# Patient Record
Sex: Female | Born: 1980 | Race: Black or African American | Hispanic: No | Marital: Single | Smoking: Former smoker
Health system: Southern US, Community
[De-identification: ages and names within clinical notes are randomized; demographics above are authoritative.]

## PROBLEM LIST (undated history)

## (undated) DIAGNOSIS — G932 Benign intracranial hypertension: Secondary | ICD-10-CM

## (undated) DIAGNOSIS — E785 Hyperlipidemia, unspecified: Secondary | ICD-10-CM

## (undated) DIAGNOSIS — G5600 Carpal tunnel syndrome, unspecified upper limb: Secondary | ICD-10-CM

## (undated) DIAGNOSIS — G459 Transient cerebral ischemic attack, unspecified: Secondary | ICD-10-CM

## (undated) HISTORY — DX: Benign intracranial hypertension: G93.2

## (undated) HISTORY — DX: Hyperlipidemia, unspecified: E78.5

---

## 2000-01-31 ENCOUNTER — Emergency Department (HOSPITAL_COMMUNITY): Admission: EM | Admit: 2000-01-31 | Discharge: 2000-02-01 | Payer: Self-pay | Admitting: Emergency Medicine

## 2005-12-02 ENCOUNTER — Emergency Department (HOSPITAL_COMMUNITY): Admission: EM | Admit: 2005-12-02 | Discharge: 2005-12-02 | Payer: Self-pay | Admitting: Emergency Medicine

## 2005-12-03 ENCOUNTER — Ambulatory Visit: Payer: Self-pay | Admitting: Cardiology

## 2005-12-30 ENCOUNTER — Ambulatory Visit: Payer: Self-pay

## 2005-12-30 ENCOUNTER — Ambulatory Visit: Payer: Self-pay | Admitting: Cardiology

## 2005-12-30 ENCOUNTER — Encounter: Payer: Self-pay | Admitting: Cardiology

## 2009-05-12 ENCOUNTER — Emergency Department (HOSPITAL_COMMUNITY): Admission: EM | Admit: 2009-05-12 | Discharge: 2009-05-12 | Payer: Self-pay | Admitting: *Deleted

## 2010-12-03 ENCOUNTER — Emergency Department (HOSPITAL_BASED_OUTPATIENT_CLINIC_OR_DEPARTMENT_OTHER)
Admission: EM | Admit: 2010-12-03 | Discharge: 2010-12-03 | Payer: Self-pay | Source: Home / Self Care | Admitting: Emergency Medicine

## 2010-12-23 HISTORY — PX: CHOLECYSTECTOMY: SHX55

## 2011-02-06 ENCOUNTER — Ambulatory Visit (HOSPITAL_COMMUNITY): Admission: RE | Admit: 2011-02-06 | Payer: Self-pay | Source: Home / Self Care | Admitting: Surgery

## 2011-02-20 ENCOUNTER — Ambulatory Visit (HOSPITAL_COMMUNITY): Admission: RE | Admit: 2011-02-20 | Payer: Self-pay | Source: Ambulatory Visit | Admitting: Surgery

## 2011-03-05 LAB — DIFFERENTIAL
Basophils Absolute: 0 10*3/uL (ref 0.0–0.1)
Eosinophils Absolute: 0.5 10*3/uL (ref 0.0–0.7)
Eosinophils Relative: 4 % (ref 0–5)
Lymphs Abs: 2 10*3/uL (ref 0.7–4.0)
Monocytes Absolute: 0.7 10*3/uL (ref 0.1–1.0)

## 2011-03-05 LAB — LIPASE, BLOOD: Lipase: 172 U/L (ref 23–300)

## 2011-03-05 LAB — COMPREHENSIVE METABOLIC PANEL
ALT: 42 U/L — ABNORMAL HIGH (ref 0–35)
AST: 67 U/L — ABNORMAL HIGH (ref 0–37)
Albumin: 4.1 g/dL (ref 3.5–5.2)
CO2: 25 mEq/L (ref 19–32)
Chloride: 106 mEq/L (ref 96–112)
GFR calc Af Amer: >60 mL/min (ref >60)
GFR calc non Af Amer: >60 mL/min (ref >60)
Sodium: 142 mEq/L (ref 135–145)
Total Bilirubin: 0.6 mg/dL (ref 0.3–1.2)

## 2011-03-05 LAB — URINALYSIS, ROUTINE W REFLEX MICROSCOPIC
Nitrite: NEGATIVE
Specific Gravity, Urine: 1.02 (ref 1.005–1.030)
pH: 7.5 (ref 5.0–8.0)

## 2011-03-05 LAB — CBC
Hemoglobin: 12.2 g/dL (ref 12.0–15.0)
RBC: 4.69 MIL/uL (ref 3.87–5.11)
WBC: 10.8 10*3/uL — ABNORMAL HIGH (ref 4.0–10.5)

## 2011-03-05 LAB — PREGNANCY, URINE: Preg Test, Ur: NEGATIVE

## 2011-04-02 LAB — COMPREHENSIVE METABOLIC PANEL
ALT: 42 U/L — ABNORMAL HIGH (ref 0–35)
Alkaline Phosphatase: 162 U/L — ABNORMAL HIGH (ref 39–117)
BUN: 12 mg/dL (ref 6–23)
CO2: 20 mEq/L (ref 19–32)
Chloride: 109 mEq/L (ref 96–112)
Glucose, Bld: 109 mg/dL — ABNORMAL HIGH (ref 70–99)
Potassium: 3.5 mEq/L (ref 3.5–5.1)
Sodium: 141 mEq/L (ref 135–145)
Total Bilirubin: 0.3 mg/dL (ref 0.3–1.2)
Total Protein: 6.9 g/dL (ref 6.0–8.3)

## 2011-04-02 LAB — URINALYSIS, ROUTINE W REFLEX MICROSCOPIC
Bilirubin Urine: NEGATIVE
Ketones, ur: NEGATIVE mg/dL
Nitrite: NEGATIVE
pH: 5.5 (ref 5.0–8.0)

## 2011-04-02 LAB — URINE MICROSCOPIC-ADD ON

## 2011-04-02 LAB — CBC
HCT: 35.6 % — ABNORMAL LOW (ref 36.0–46.0)
Hemoglobin: 11.7 g/dL — ABNORMAL LOW (ref 12.0–15.0)
RBC: 4.38 MIL/uL (ref 3.87–5.11)
WBC: 13 10*3/uL — ABNORMAL HIGH (ref 4.0–10.5)

## 2011-04-02 LAB — PROTIME-INR
INR: 1 (ref 0.00–1.49)
Prothrombin Time: 13 seconds (ref 11.6–15.2)

## 2011-04-02 LAB — DIFFERENTIAL
Basophils Absolute: 0.1 10*3/uL (ref 0.0–0.1)
Basophils Relative: 1 % (ref 0–1)
Eosinophils Absolute: 0.5 10*3/uL (ref 0.0–0.7)
Monocytes Relative: 2 % — ABNORMAL LOW (ref 3–12)
Neutro Abs: 9.5 10*3/uL — ABNORMAL HIGH (ref 1.7–7.7)
Neutrophils Relative %: 73 % (ref 43–77)

## 2011-04-02 LAB — RAPID URINE DRUG SCREEN, HOSP PERFORMED
Barbiturates: NOT DETECTED
Benzodiazepines: NOT DETECTED

## 2011-04-02 LAB — POCT PREGNANCY, URINE: Preg Test, Ur: NEGATIVE

## 2011-04-02 LAB — GLUCOSE, CAPILLARY: Glucose-Capillary: 125 mg/dL — ABNORMAL HIGH (ref 70–99)

## 2011-04-02 LAB — ETHANOL: Alcohol, Ethyl (B): 124 mg/dL — ABNORMAL HIGH (ref 0–10)

## 2011-04-02 LAB — ACETAMINOPHEN LEVEL: Acetaminophen (Tylenol), Serum: <10 ug/mL — ABNORMAL LOW (ref 10–30)

## 2011-04-11 ENCOUNTER — Emergency Department (HOSPITAL_BASED_OUTPATIENT_CLINIC_OR_DEPARTMENT_OTHER)
Admission: EM | Admit: 2011-04-11 | Discharge: 2011-04-12 | Disposition: A | Discharge status: Home / Self Care | Payer: BC Managed Care – PPO | Attending: Emergency Medicine | Admitting: Emergency Medicine

## 2011-04-11 DIAGNOSIS — R1013 Epigastric pain: Secondary | ICD-10-CM | POA: Insufficient documentation

## 2011-04-11 DIAGNOSIS — F17200 Nicotine dependence, unspecified, uncomplicated: Secondary | ICD-10-CM | POA: Insufficient documentation

## 2011-04-11 DIAGNOSIS — J45909 Unspecified asthma, uncomplicated: Secondary | ICD-10-CM | POA: Insufficient documentation

## 2011-04-11 DIAGNOSIS — K8020 Calculus of gallbladder without cholecystitis without obstruction: Secondary | ICD-10-CM | POA: Insufficient documentation

## 2011-04-11 LAB — DIFFERENTIAL
Basophils Absolute: 0 10*3/uL (ref 0.0–0.1)
Basophils Relative: 0 % (ref 0–1)
Eosinophils Absolute: 0.5 10*3/uL (ref 0.0–0.7)
Eosinophils Relative: 4 % (ref 0–5)
Lymphocytes Relative: 28 % (ref 12–46)
Lymphs Abs: 3.4 K/uL (ref 0.7–4.0)
Monocytes Absolute: 1.2 K/uL — ABNORMAL HIGH (ref 0.1–1.0)
Monocytes Relative: 10 % (ref 3–12)
Neutro Abs: 6.8 10*3/uL (ref 1.7–7.7)
Neutrophils Relative %: 57 % (ref 43–77)

## 2011-04-11 LAB — URINALYSIS, ROUTINE W REFLEX MICROSCOPIC
Glucose, UA: NEGATIVE mg/dL
Hgb urine dipstick: NEGATIVE
Protein, ur: NEGATIVE mg/dL
Specific Gravity, Urine: 1.033 — ABNORMAL HIGH (ref 1.005–1.030)
Urobilinogen, UA: 2 mg/dL — ABNORMAL HIGH (ref 0.0–1.0)

## 2011-04-11 LAB — CBC
HCT: 34.2 % — ABNORMAL LOW (ref 36.0–46.0)
Hemoglobin: 10.9 g/dL — ABNORMAL LOW (ref 12.0–15.0)
MCH: 26.5 pg (ref 26.0–34.0)
MCHC: 31.9 g/dL (ref 30.0–36.0)
MCV: 83 fL (ref 78.0–100.0)
Platelets: 291 10*3/uL (ref 150–400)
RBC: 4.12 MIL/uL (ref 3.87–5.11)
RDW: 13.2 % (ref 11.5–15.5)
WBC: 11.9 10*3/uL — ABNORMAL HIGH (ref 4.0–10.5)

## 2011-04-11 LAB — COMPREHENSIVE METABOLIC PANEL
ALT: 51 U/L — ABNORMAL HIGH (ref 0–35)
AST: 77 U/L — ABNORMAL HIGH (ref 0–37)
Albumin: 3.5 g/dL (ref 3.5–5.2)
Alkaline Phosphatase: 136 U/L — ABNORMAL HIGH (ref 39–117)
CO2: 25 mEq/L (ref 19–32)
Chloride: 109 mEq/L (ref 96–112)
Creatinine, Ser: 0.6 mg/dL (ref 0.4–1.2)
GFR calc Af Amer: >60 mL/min (ref >60)
GFR calc non Af Amer: >60 mL/min (ref >60)
Potassium: 3.7 mEq/L (ref 3.5–5.1)
Sodium: 145 mEq/L (ref 135–145)
Total Bilirubin: 0.4 mg/dL (ref 0.3–1.2)

## 2011-04-11 LAB — LIPASE, BLOOD: Lipase: 265 U/L (ref 23–300)

## 2011-04-11 LAB — COMPREHENSIVE METABOLIC PANEL WITH GFR
BUN: 14 mg/dL (ref 6–23)
Calcium: 8.8 mg/dL (ref 8.4–10.5)
Glucose, Bld: 110 mg/dL — ABNORMAL HIGH (ref 70–99)
Total Protein: 6.6 g/dL (ref 6.0–8.3)

## 2011-04-11 LAB — PREGNANCY, URINE: Preg Test, Ur: NEGATIVE

## 2011-05-02 ENCOUNTER — Other Ambulatory Visit (HOSPITAL_COMMUNITY): Payer: BC Managed Care – PPO

## 2011-05-03 ENCOUNTER — Encounter (HOSPITAL_COMMUNITY)
Admission: RE | Admit: 2011-05-03 | Discharge: 2011-05-03 | Disposition: A | Discharge status: Home / Self Care | Payer: BC Managed Care – PPO | Source: Ambulatory Visit | Attending: Surgery | Admitting: Surgery

## 2011-05-03 LAB — CBC
MCH: 26.1 pg (ref 26.0–34.0)
MCHC: 30.8 g/dL (ref 30.0–36.0)
MCV: 84.8 fL (ref 78.0–100.0)
Platelets: 324 10*3/uL (ref 150–400)
RBC: 4.86 MIL/uL (ref 3.87–5.11)

## 2011-05-03 LAB — COMPREHENSIVE METABOLIC PANEL
Albumin: 3.8 g/dL (ref 3.5–5.2)
BUN: 8 mg/dL (ref 6–23)
CO2: 30 mEq/L (ref 19–32)
Calcium: 10.6 mg/dL — ABNORMAL HIGH (ref 8.4–10.5)
Chloride: 104 mEq/L (ref 96–112)
Creatinine, Ser: 0.65 mg/dL (ref 0.4–1.2)
GFR calc Af Amer: >60 mL/min (ref >60)
GFR calc non Af Amer: >60 mL/min (ref >60)
Total Bilirubin: 0.3 mg/dL (ref 0.3–1.2)

## 2011-05-03 LAB — SURGICAL PCR SCREEN
MRSA, PCR: NEGATIVE
Staphylococcus aureus: NEGATIVE

## 2011-05-08 ENCOUNTER — Ambulatory Visit (HOSPITAL_COMMUNITY)
Admission: RE | Admit: 2011-05-08 | Discharge: 2011-05-08 | Disposition: A | Discharge status: Home / Self Care | Payer: BC Managed Care – PPO | Source: Ambulatory Visit | Attending: Surgery | Admitting: Surgery

## 2011-05-08 ENCOUNTER — Other Ambulatory Visit (INDEPENDENT_AMBULATORY_CARE_PROVIDER_SITE_OTHER): Payer: Self-pay | Admitting: Surgery

## 2011-05-08 DIAGNOSIS — K8020 Calculus of gallbladder without cholecystitis without obstruction: Secondary | ICD-10-CM | POA: Insufficient documentation

## 2011-05-08 DIAGNOSIS — F17200 Nicotine dependence, unspecified, uncomplicated: Secondary | ICD-10-CM | POA: Insufficient documentation

## 2011-05-08 DIAGNOSIS — Z01812 Encounter for preprocedural laboratory examination: Secondary | ICD-10-CM | POA: Insufficient documentation

## 2011-05-08 DIAGNOSIS — F411 Generalized anxiety disorder: Secondary | ICD-10-CM | POA: Insufficient documentation

## 2011-05-08 DIAGNOSIS — J45909 Unspecified asthma, uncomplicated: Secondary | ICD-10-CM | POA: Insufficient documentation

## 2011-05-17 NOTE — Op Note (Signed)
[name], [name]           ACCOUNT NO.:  [id number]  MEDICAL RECORD NO.:  [id number]           PATIENT TYPE:  O  LOCATION:  [facility]                         FACILITY:  [facility]  PHYSICIAN:  Abigail Miyamoto, M.D. DATE OF BIRTH:  07-01-1981  DATE OF PROCEDURE:  05/08/2011 DATE OF DISCHARGE:  05/08/2011                              OPERATIVE REPORT   PREOPERATIVE DIAGNOSIS:  Symptomatic cholelithiasis.  POSTOPERATIVE DIAGNOSIS:  Symptomatic cholelithiasis.  PROCEDURE:  Laparoscopic cholecystectomy.  SURGEON:  Abigail Miyamoto, MD  ANESTHESIA:  General and 0.5% Marcaine.  ESTIMATED BLOOD LOSS:  Minimal.  FINDINGS:  The patient has a chronic scarred-appearing gallbladder with gallstones.  PROCEDURE IN DETAIL:  The patient was brought to the operating room, identified as Nancy Buchanan.  She was placed supine on the operating table and general anesthesia was induced.  Her abdomen was then prepped and draped in the usual sterile fashion.  Using a #15 blade, a small vertical incision was made below the umbilicus.  This was carried down to the fascia, which was then opened with scalpel.  Hemostat was then used to pass the peritoneal cavity under direct vision.  Next, a 0 Vicryl pursestring suture was placed around the fascial opening.  The Hasson port was placed through the opening and insufflation of the abdomen was begun.  A 5-mm port was then placed in the patient epigastrium and two more in the right upper quadrant, all under direct vision.  The gallbladder was then grasped and retracted above the liver bed.  Several adhesions to the gallbladder were then taken down bluntly. The cystic duct and cystic artery were easily dissected out.  I clipped the artery twice proximally, once distally, and transected it.  This allowed me to better visualize the critical window around the cystic duct.  I then clipped the cystic duct three times proximally, once distally and transected  it.  There was a small posterior branch of this cystic artery, which I clipped as well.  The gallbladder was then slowly dissected free from the liver bed with the electrocautery.  The gallbladder was entered during the dissection, but no stones spilled out.  Once the gallbladder was free from the liver bed, hemostasis was achieved with the cautery.  I placed the gallbladder and an endosac and removed it through the incision at the umbilicus.  The liver bed was again examined.  Hemostasis was felt to be achieved.  I then thoroughly irrigated the abdomen with normal saline.  All ports were then removed under direct vision, and the abdomen was deflated.  The 0 Vicryl at the umbilicus was tied in place closing the fascial defect.  All incisions were then anesthetized with Marcaine and closed with 4-0 Monocryl subcuticular sutures.  Steri-Strips and Band-Aids were then applied.  The patient tolerated the procedure well.  All counts were correct at the end of the procedure.  The patient was then extubated in the operating room and taken in a stable condition to recovery room.     Abigail Miyamoto, M.D.     DB/MEDQ  D:  05/08/2011  T:  05/09/2011  Job:  [id number]  Electronically Signed by  Abigail Miyamoto M.D. on 05/17/2011 08:57:34 PM

## 2011-06-04 ENCOUNTER — Encounter (INDEPENDENT_AMBULATORY_CARE_PROVIDER_SITE_OTHER): Payer: Self-pay | Admitting: Surgery

## 2012-01-23 ENCOUNTER — Other Ambulatory Visit: Payer: Self-pay | Admitting: Neurology

## 2012-01-23 ENCOUNTER — Other Ambulatory Visit (HOSPITAL_COMMUNITY): Payer: Self-pay | Admitting: Neurology

## 2012-01-28 ENCOUNTER — Other Ambulatory Visit: Payer: Self-pay | Admitting: Radiology

## 2012-01-28 ENCOUNTER — Other Ambulatory Visit: Payer: BC Managed Care – PPO

## 2012-01-29 ENCOUNTER — Ambulatory Visit (HOSPITAL_COMMUNITY): Admission: RE | Admit: 2012-01-29 | Payer: BC Managed Care – PPO | Source: Ambulatory Visit

## 2012-01-29 ENCOUNTER — Ambulatory Visit
Admission: RE | Admit: 2012-01-29 | Discharge: 2012-01-29 | Disposition: A | Discharge status: Home / Self Care | Payer: BC Managed Care – PPO | Source: Ambulatory Visit | Attending: Neurology | Admitting: Neurology

## 2012-01-29 NOTE — Progress Notes (Addendum)
Explained discharge instructions to pt and her Mom, also made out work excuse for tomorrow.  1325 taking po's well.

## 2012-02-09 ENCOUNTER — Emergency Department (INDEPENDENT_AMBULATORY_CARE_PROVIDER_SITE_OTHER): Payer: BC Managed Care – PPO

## 2012-02-09 ENCOUNTER — Encounter (HOSPITAL_BASED_OUTPATIENT_CLINIC_OR_DEPARTMENT_OTHER): Payer: Self-pay | Admitting: Emergency Medicine

## 2012-02-09 ENCOUNTER — Emergency Department (HOSPITAL_BASED_OUTPATIENT_CLINIC_OR_DEPARTMENT_OTHER)
Admission: EM | Admit: 2012-02-09 | Discharge: 2012-02-10 | Disposition: A | Discharge status: Home / Self Care | Payer: BC Managed Care – PPO | Attending: Emergency Medicine | Admitting: Emergency Medicine

## 2012-02-09 DIAGNOSIS — J209 Acute bronchitis, unspecified: Secondary | ICD-10-CM

## 2012-02-09 DIAGNOSIS — J45909 Unspecified asthma, uncomplicated: Secondary | ICD-10-CM | POA: Insufficient documentation

## 2012-02-09 DIAGNOSIS — R05 Cough: Secondary | ICD-10-CM

## 2012-02-09 DIAGNOSIS — R059 Cough, unspecified: Secondary | ICD-10-CM

## 2012-02-09 DIAGNOSIS — Z79899 Other long term (current) drug therapy: Secondary | ICD-10-CM | POA: Insufficient documentation

## 2012-02-09 DIAGNOSIS — R0602 Shortness of breath: Secondary | ICD-10-CM

## 2012-02-09 DIAGNOSIS — F17200 Nicotine dependence, unspecified, uncomplicated: Secondary | ICD-10-CM

## 2012-02-09 DIAGNOSIS — E785 Hyperlipidemia, unspecified: Secondary | ICD-10-CM | POA: Insufficient documentation

## 2012-02-09 DIAGNOSIS — R0609 Other forms of dyspnea: Secondary | ICD-10-CM

## 2012-02-09 HISTORY — DX: Benign intracranial hypertension: G93.2

## 2012-02-09 MED ORDER — ALBUTEROL SULFATE (5 MG/ML) 0.5% IN NEBU
5.0000 mg | INHALATION_SOLUTION | Freq: Once | RESPIRATORY_TRACT | Status: AC
  Administered 2012-02-09: 5 mg via RESPIRATORY_TRACT

## 2012-02-09 MED ORDER — PREDNISONE 10 MG PO TABS
ORAL_TABLET | ORAL | Status: AC
  Administered 2012-02-09: 10 mg via ORAL
  Filled 2012-02-09: qty 1

## 2012-02-09 MED ORDER — PREDNISONE 50 MG PO TABS
ORAL_TABLET | ORAL | Status: AC
  Administered 2012-02-09: 50 mg via ORAL
  Filled 2012-02-09: qty 1

## 2012-02-09 MED ORDER — ALBUTEROL SULFATE HFA 108 (90 BASE) MCG/ACT IN AERS: 1.0000 | INHALATION_SPRAY | Freq: Four times a day (QID) | RESPIRATORY_TRACT | Status: DC | PRN

## 2012-02-09 MED ORDER — PREDNISONE 50 MG PO TABS: 60.0000 mg | ORAL_TABLET | Freq: Every day | ORAL | Status: DC

## 2012-02-09 MED ORDER — PREDNISONE 10 MG PO TABS: ORAL_TABLET | ORAL | Status: DC

## 2012-02-09 MED ORDER — ALBUTEROL SULFATE (5 MG/ML) 0.5% IN NEBU
INHALATION_SOLUTION | RESPIRATORY_TRACT | Status: AC
  Administered 2012-02-09: 5 mg via RESPIRATORY_TRACT
  Filled 2012-02-09: qty 1

## 2012-02-09 NOTE — ED Notes (Signed)
Pt has hx of asthma- cough since yesterday- reports she feels sob- speaking complete sentences in triage

## 2012-02-09 NOTE — ED Provider Notes (Signed)
History     CSN: [id number]  Arrival date & time 02/09/12  2013   First MD Initiated Contact with Patient 02/09/12 2050      Chief Complaint  Patient presents with  . Cough  . Shortness of Breath    (Consider location/radiation/quality/duration/timing/severity/associated sxs/prior treatment) Patient is a 31 y.o. female presenting with cough. The history is provided by the patient. No language interpreter was used.  Cough This is a new problem. The current episode started 12 to 24 hours ago. The problem occurs constantly. The problem has not changed since onset.The cough is non-productive. The fever has been present for 1 to 2 days. Associated symptoms include ear congestion, rhinorrhea, shortness of breath and wheezing. She has tried nothing for the symptoms. She is a smoker. Her past medical history is significant for asthma. Her past medical history does not include pneumonia.   patient complains of shortness of breath. She has a history of asthma she began having trouble with wheezing yesterday patient reports increased shortness of breath today  Past Medical History  Diagnosis Date  . Asthma   . Hyperlipidemia   . Intracranial hypertension     Past Surgical History  Procedure Date  . Cholecystectomy     Family History  Problem Relation Age of Onset  . Diabetes Father     History  Substance Use Topics  . Smoking status: Current Some Day Smoker    Types: Cigars  . Smokeless tobacco: Never Used  . Alcohol Use: Yes     once a month    OB History    Grav Para Term Preterm Abortions TAB SAB Ect Mult Living                  Review of Systems  HENT: Positive for rhinorrhea.   Respiratory: Positive for cough, shortness of breath and wheezing.   All other systems reviewed and are negative.    Allergies  Other  Home Medications   Current Outpatient Rx  Name Route Sig Dispense Refill  . ACETAZOLAMIDE 250 MG PO TABS Oral Take 250 mg by mouth daily.    .  ALBUTEROL SULFATE HFA 108 (90 BASE) MCG/ACT IN AERS Inhalation Inhale 2 puffs into the lungs every 6 (six) hours as needed. For shortness of breath or wheezing    . CITALOPRAM HYDROBROMIDE 10 MG PO TABS Oral Take 10 mg by mouth daily.    Marland Kitchen FLUTICASONE PROPIONATE 50 MCG/ACT NA SUSP Nasal Place 1 spray into the nose daily.    . CHORIONIC GONADOTROPIN 78295 UNITS IM SOLR Intramuscular Inject 10,000 Units into the muscle every 7 (seven) days.    Marland Kitchen MONTELUKAST SODIUM 10 MG PO TABS Oral Take 10 mg by mouth at bedtime.    Marland Kitchen PHENTERMINE HCL 37.5 MG PO CAPS Oral Take 37.5 mg by mouth every morning.       BP 111/79  Pulse 127  Temp(Src) 98.2 F (36.8 C) (Oral)  Resp 20  Ht 5\' 5"  (1.651 m)  Wt 213 lb (96.616 kg)  BMI 35.44 kg/m2  SpO2 99%  LMP 01/19/2012  Physical Exam  Nursing note and vitals reviewed. Constitutional: She is oriented to person, place, and time. She appears well-developed and well-nourished.  HENT:  Head: Normocephalic and atraumatic.  Right Ear: External ear normal.  Left Ear: External ear normal.  Nose: Nose normal.  Mouth/Throat: Oropharynx is clear and moist.  Eyes: Conjunctivae and EOM are normal. Pupils are equal, round, and reactive to light.  Neck: Normal range of motion. Neck supple.  Cardiovascular: Normal rate and normal heart sounds.   Pulmonary/Chest: Effort normal. She has wheezes.  Abdominal: Soft.  Musculoskeletal: Normal range of motion.  Neurological: She is alert and oriented to person, place, and time.  Skin: Skin is warm and dry.  Psychiatric: She has a normal mood and affect.    ED Course  Procedures (including critical care time)  Labs Reviewed - No data to display No results found.   No diagnosis found.    MDM  Patient is given albuterol neb patient reports decreased tightness in her chest    Chest x-ray shows mild peribronchial thickening consistent with asthma. Patient is given 60 of prednisone here she is placed on 5 days of  prednisone patient is advised to see her physician for recheck she is to use her albuterol inhaler 2 puffs every 4 hours.    Langston Masker, [place] 02/09/12 2313

## 2012-02-10 MED ORDER — AZITHROMYCIN 250 MG PO TABS: 250.0000 mg | ORAL_TABLET | Freq: Every day | ORAL | Status: AC

## 2012-02-10 NOTE — ED Provider Notes (Signed)
31 year old female with a history of asthma presents with approximately 24 hours of coughing shortness of breath and wheezing. Symptoms are persistent, nothing is made this better or worse and it is associated with subjective fevers and chills at home.  Physical exam:  After nebulizer therapy, patient has minimal end expiratory wheezing, no increased work of breathing and no accessory muscle use. Oropharynx is mildly erythematous but no exudate asymmetry or hypertrophy, no cervical lymphadenopathy  Assessment  chest x-ray shows no signs of bacterial type infiltrate, this appears to be reactive airway disease related to likely bronchitis on top of asthma. Patient has been given albuterol, prednisone and we'll send him with a prescription for Zithromax should she not improve over the next several days.  Medical screening examination/treatment/procedure(s) were conducted as a shared visit with non-physician practitioner(s) and myself.  I personally evaluated the patient during the encounter :  Nancy Roller, MD 02/10/12 [phone]

## 2012-02-10 NOTE — Discharge Instructions (Signed)
If you have not improved in 2-3 days, fill the antibiotic at the pharmacy. Return to the ER for severe or worsening symptoms   Asthma, Adult Asthma is a disease of the lungs and can make it hard to breathe. Asthma cannot be cured, but medicine can help control it. Asthma may be started (triggered) by:  Pollen.   Dust.   Animal skin flakes (dander).   Molds.   Foods.   Respiratory infections (colds, flu).   Smoke.   Exercise.   Stress.   Other things that cause allergic reactions or allergies (allergens).  HOME CARE   Talk to your doctor about how to manage your attacks at home. This may include:   Using a tool called a peak flow meter.   Having medicine ready to stop the attack.   Take all medicine as told by your doctor.   Wash bed sheets and blankets every week in hot water and put them in the dryer.   Drink enough fluids to keep your pee (urine) clear or pale yellow.   Always be ready to get emergency help. Write down the phone number for your doctor. Keep it where you can easily find it.   Talk about exercise routines with your doctor.   If animal dander is causing your asthma, you may need to find a new home for your pet(s).  GET HELP RIGHT AWAY IF:   You have muscle aches.   You cough more.   You have chest pain.   You have thick spit (sputum) that changes to yellow, green, gray, or bloody.   Medicine does not stop your wheezing.   You have problems breathing.   You have a fever.   Your medicine causes:   A rash.   Itching.   Puffiness (swelling).   Breathing problems.  MAKE SURE YOU:   Understand these instructions.   Will watch your condition.   Will get help right away if you are not doing well or get worse.  Document Released: 05/27/2008 Document Revised: 08/21/2011 Document Reviewed: 10/19/2008 [facility] Patient Information 2012 [place], [place].

## 2012-02-10 NOTE — ED Notes (Signed)
Patient stated that she didn't feel right and she felt flushed and warm. Dr. Hyacinth Meeker went to speak with the patient prior to her being discharged.

## 2012-12-24 ENCOUNTER — Encounter (HOSPITAL_BASED_OUTPATIENT_CLINIC_OR_DEPARTMENT_OTHER): Payer: Self-pay | Admitting: *Deleted

## 2012-12-24 ENCOUNTER — Emergency Department (HOSPITAL_BASED_OUTPATIENT_CLINIC_OR_DEPARTMENT_OTHER): Payer: BC Managed Care – PPO

## 2012-12-24 ENCOUNTER — Emergency Department (HOSPITAL_BASED_OUTPATIENT_CLINIC_OR_DEPARTMENT_OTHER)
Admission: EM | Admit: 2012-12-24 | Discharge: 2012-12-25 | Disposition: A | Discharge status: Home / Self Care | Payer: BC Managed Care – PPO | Attending: Emergency Medicine | Admitting: Emergency Medicine

## 2012-12-24 DIAGNOSIS — E785 Hyperlipidemia, unspecified: Secondary | ICD-10-CM | POA: Insufficient documentation

## 2012-12-24 DIAGNOSIS — Z9089 Acquired absence of other organs: Secondary | ICD-10-CM | POA: Insufficient documentation

## 2012-12-24 DIAGNOSIS — F17200 Nicotine dependence, unspecified, uncomplicated: Secondary | ICD-10-CM | POA: Insufficient documentation

## 2012-12-24 DIAGNOSIS — Z79899 Other long term (current) drug therapy: Secondary | ICD-10-CM | POA: Insufficient documentation

## 2012-12-24 DIAGNOSIS — G932 Benign intracranial hypertension: Secondary | ICD-10-CM | POA: Insufficient documentation

## 2012-12-24 DIAGNOSIS — J45901 Unspecified asthma with (acute) exacerbation: Secondary | ICD-10-CM

## 2012-12-24 MED ORDER — METHYLPREDNISOLONE SODIUM SUCC 125 MG IJ SOLR
125.0000 mg | Freq: Once | INTRAMUSCULAR | Status: AC
  Administered 2012-12-25: 125 mg via INTRAVENOUS
  Filled 2012-12-24: qty 2

## 2012-12-24 MED ORDER — ALBUTEROL SULFATE (5 MG/ML) 0.5% IN NEBU
15.0000 mg | INHALATION_SOLUTION | RESPIRATORY_TRACT | Status: DC
  Administered 2012-12-24: 15 mg via RESPIRATORY_TRACT

## 2012-12-24 MED ORDER — ALBUTEROL SULFATE (5 MG/ML) 0.5% IN NEBU
INHALATION_SOLUTION | RESPIRATORY_TRACT | Status: AC
  Filled 2012-12-24: qty 0.5

## 2012-12-24 NOTE — ED Notes (Signed)
Pt c/o asthma attack that began apprx. 30 min ago. Pt reports no relief with inhaler.

## 2012-12-24 NOTE — ED Provider Notes (Addendum)
History     CSN: [id number]  Arrival date & time 12/24/12  2340   First MD Initiated Contact with Patient 12/24/12 2347      Chief Complaint  Patient presents with  . Asthma    (Consider location/radiation/quality/duration/timing/severity/associated sxs/prior treatment) Patient is a 32 y.o. female presenting with asthma. The history is provided by the patient. No language interpreter was used.  Asthma This is a recurrent problem. The current episode started less than 1 hour ago. The problem occurs constantly. The problem has not changed since onset.Associated symptoms include shortness of breath. Pertinent negatives include no chest pain, no abdominal pain and no headaches. Nothing aggravates the symptoms. Nothing relieves the symptoms. She has tried nothing for the symptoms. The treatment provided no relief.    Past Medical History  Diagnosis Date  . Asthma   . Hyperlipidemia   . Intracranial hypertension     Past Surgical History  Procedure Date  . Cholecystectomy     Family History  Problem Relation Age of Onset  . Diabetes Father     History  Substance Use Topics  . Smoking status: Current Some Day Smoker    Types: Cigars  . Smokeless tobacco: Never Used  . Alcohol Use: Yes     Comment: once a month    OB History    Grav Para Term Preterm Abortions TAB SAB Ect Mult Living                  Review of Systems  Respiratory: Positive for shortness of breath and wheezing.   Cardiovascular: Negative for chest pain.  Gastrointestinal: Negative for abdominal pain.  Neurological: Negative for headaches.  All other systems reviewed and are negative.    Allergies  Other  Home Medications   Current Outpatient Rx  Name  Route  Sig  Dispense  Refill  . ACETAZOLAMIDE 250 MG PO TABS   Oral   Take 250 mg by mouth daily.         . ALBUTEROL SULFATE HFA 108 (90 BASE) MCG/ACT IN AERS   Inhalation   Inhale 2 puffs into the lungs every 6 (six) hours as needed.  For shortness of breath or wheezing         . ALBUTEROL SULFATE HFA 108 (90 BASE) MCG/ACT IN AERS   Inhalation   Inhale 1-2 puffs into the lungs every 6 (six) hours as needed for wheezing.   1 Inhaler   0   . CITALOPRAM HYDROBROMIDE 10 MG PO TABS   Oral   Take 10 mg by mouth daily.         Marland Kitchen FLUTICASONE PROPIONATE 50 MCG/ACT NA SUSP   Nasal   Place 1 spray into the nose daily.         . CHORIONIC GONADOTROPIN 08657 UNITS IM SOLR   Intramuscular   Inject 10,000 Units into the muscle every 7 (seven) days.         Marland Kitchen MONTELUKAST SODIUM 10 MG PO TABS   Oral   Take 10 mg by mouth at bedtime.         Marland Kitchen PHENTERMINE HCL 37.5 MG PO CAPS   Oral   Take 37.5 mg by mouth every morning.          Marland Kitchen PREDNISONE 10 MG PO TABS      5,4,3,2,1 taper   15 tablet   0     BP 110/96  Pulse 110  Temp 97.9 F (36.6 C) (Oral)  Resp 40  SpO2 99%  Physical Exam  Constitutional: She is oriented to person, place, and time. She appears well-developed and well-nourished.  HENT:  Head: Normocephalic and atraumatic.  Mouth/Throat: Oropharynx is clear and moist.  Eyes: Conjunctivae normal are normal. Pupils are equal, round, and reactive to light.  Neck: Normal range of motion. Neck supple. No tracheal deviation present.  Cardiovascular: Normal rate, regular rhythm and intact distal pulses.   Pulmonary/Chest: No stridor. She has wheezes.  Abdominal: Soft. Bowel sounds are normal. There is no tenderness. There is no rebound and no guarding.  Musculoskeletal: Normal range of motion.  Neurological: She is alert and oriented to person, place, and time.  Skin: Skin is warm and dry. She is not diaphoretic.  Psychiatric: She has a normal mood and affect.    ED Course  Procedures (including critical care time)  Labs Reviewed - No data to display No results found.   No diagnosis found.    MDM  MDM Reviewed: nursing note and vitals Interpretation: x-ray and labs Total time  providing critical care: 30-74 minutes. This excludes time spent performing separately reportable procedures and services.    CRITICAL CARE Performed by: Jasmine Awe   Total critical care time: 30 minutes  Critical care time was exclusive of separately billable procedures and treating other patients.  Critical care was necessary to treat or prevent imminent or life-threatening deterioration.  Critical care was time spent personally by me on the following activities: development of treatment plan with patient and/or surrogate as well as nursing, discussions with consultants, evaluation of patient's response to treatment, examination of patient, obtaining history from patient or surrogate, ordering and performing treatments and interventions, ordering and review of laboratory studies, ordering and review of radiographic studies, pulse oximetry and re-evaluation of patient's condition.    Return for worsening symptoms sats do not drop with walking,  Clear to ausculation following 4 hour neb.  Will d/c with steroids follow up with your family doctor today for recheck.  Patient verbalizes understanding and agrees to follow up    Tracen Mahler K Jlyn Cerros-Rasch, MD 12/25/12 0433  Suhana Wilner K Appolonia Ackert-Rasch, MD 12/25/12 [phone]

## 2012-12-25 LAB — CBC WITH DIFFERENTIAL/PLATELET
Basophils Relative: 0 % (ref 0–1)
Hemoglobin: 11.2 g/dL — ABNORMAL LOW (ref 12.0–15.0)
Lymphocytes Relative: 30 % (ref 12–46)
MCHC: 31.4 g/dL (ref 30.0–36.0)
Monocytes Relative: 7 % (ref 3–12)
Neutro Abs: 6.6 10*3/uL (ref 1.7–7.7)
Neutrophils Relative %: 57 % (ref 43–77)
RBC: 4.18 MIL/uL (ref 3.87–5.11)
WBC: 11.4 10*3/uL — ABNORMAL HIGH (ref 4.0–10.5)

## 2012-12-25 LAB — BASIC METABOLIC PANEL
BUN: 17 mg/dL (ref 6–23)
CO2: 21 mEq/L (ref 19–32)
Chloride: 106 mEq/L (ref 96–112)
GFR calc Af Amer: >90 mL/min (ref >90)
Potassium: 3.5 mEq/L (ref 3.5–5.1)

## 2012-12-25 LAB — MAGNESIUM: Magnesium: 1.9 mg/dL (ref 1.5–2.5)

## 2012-12-25 MED ORDER — SODIUM CHLORIDE 0.9 % IV SOLN
Freq: Once | INTRAVENOUS | Status: AC
  Administered 2012-12-25: via INTRAVENOUS

## 2012-12-25 MED ORDER — ALBUTEROL SULFATE HFA 108 (90 BASE) MCG/ACT IN AERS: 1.0000 | INHALATION_SPRAY | Freq: Four times a day (QID) | RESPIRATORY_TRACT | Status: DC | PRN

## 2012-12-25 MED ORDER — PREDNISONE 50 MG PO TABS: 50.0000 mg | ORAL_TABLET | Freq: Every day | ORAL | Status: DC

## 2012-12-25 NOTE — Progress Notes (Signed)
Pt CAT completed. Pt resting sats 95%. BS bilat diminished in bases with E/wheezes . Will continue to monitor . MD notified of progress.

## 2012-12-25 NOTE — ED Notes (Signed)
MD at bedside. 

## 2012-12-25 NOTE — ED Notes (Signed)
X-Ray at bedside.

## 2012-12-25 NOTE — Procedures (Signed)
Per physician to ambulate pt and monitor sats and WOB. Pt walked around dept. Maintaining saturations of no lower than 97% on room air. Pt steady on feet and in no distress. MD notfied of pt's condition. Pt returned to bedside.

## 2012-12-25 NOTE — ED Notes (Signed)
RTT at bedside

## 2013-01-31 ENCOUNTER — Emergency Department (HOSPITAL_BASED_OUTPATIENT_CLINIC_OR_DEPARTMENT_OTHER)
Admission: EM | Admit: 2013-01-31 | Discharge: 2013-01-31 | Disposition: A | Discharge status: Home / Self Care | Payer: BC Managed Care – PPO | Attending: Emergency Medicine | Admitting: Emergency Medicine

## 2013-01-31 ENCOUNTER — Encounter (HOSPITAL_BASED_OUTPATIENT_CLINIC_OR_DEPARTMENT_OTHER): Payer: Self-pay | Admitting: *Deleted

## 2013-01-31 DIAGNOSIS — J45909 Unspecified asthma, uncomplicated: Secondary | ICD-10-CM | POA: Insufficient documentation

## 2013-01-31 DIAGNOSIS — F17200 Nicotine dependence, unspecified, uncomplicated: Secondary | ICD-10-CM | POA: Insufficient documentation

## 2013-01-31 DIAGNOSIS — S61209A Unspecified open wound of unspecified finger without damage to nail, initial encounter: Secondary | ICD-10-CM | POA: Insufficient documentation

## 2013-01-31 DIAGNOSIS — Z79899 Other long term (current) drug therapy: Secondary | ICD-10-CM | POA: Insufficient documentation

## 2013-01-31 DIAGNOSIS — Y93G1 Activity, food preparation and clean up: Secondary | ICD-10-CM | POA: Insufficient documentation

## 2013-01-31 DIAGNOSIS — S61019A Laceration without foreign body of unspecified thumb without damage to nail, initial encounter: Secondary | ICD-10-CM

## 2013-01-31 DIAGNOSIS — Z8639 Personal history of other endocrine, nutritional and metabolic disease: Secondary | ICD-10-CM | POA: Insufficient documentation

## 2013-01-31 DIAGNOSIS — W268XXA Contact with other sharp object(s), not elsewhere classified, initial encounter: Secondary | ICD-10-CM | POA: Insufficient documentation

## 2013-01-31 DIAGNOSIS — Y929 Unspecified place or not applicable: Secondary | ICD-10-CM | POA: Insufficient documentation

## 2013-01-31 DIAGNOSIS — Z862 Personal history of diseases of the blood and blood-forming organs and certain disorders involving the immune mechanism: Secondary | ICD-10-CM | POA: Insufficient documentation

## 2013-01-31 DIAGNOSIS — IMO0002 Reserved for concepts with insufficient information to code with codable children: Secondary | ICD-10-CM | POA: Insufficient documentation

## 2013-01-31 MED ORDER — HYDROCODONE-ACETAMINOPHEN 5-325 MG PO TABS: 2.0000 | ORAL_TABLET | ORAL | Status: DC | PRN

## 2013-01-31 NOTE — ED Provider Notes (Signed)
History     CSN: [id number]  Arrival date & time 01/31/13  1436   First MD Initiated Contact with Patient 01/31/13 1702      Chief Complaint  Patient presents with  . Laceration    (Consider location/radiation/quality/duration/timing/severity/associated sxs/prior treatment) Patient is a 32 y.o. female presenting with skin laceration. The history is provided by the patient. No language interpreter was used.  Laceration Location:  Finger Finger laceration location:  L thumb Length (cm):  4mm Depth:  Through dermis Quality: straight   Laceration mechanism:  Broken glass Relieved by:  Nothing Pt cut her finger with a blender blade.   Pt has a small punture wound at the base of thumb.   Pt unable to fully extend thumb.    Past Medical History  Diagnosis Date  . Asthma   . Hyperlipidemia   . Intracranial hypertension     Past Surgical History  Procedure Laterality Date  . Cholecystectomy      Family History  Problem Relation Age of Onset  . Diabetes Father     History  Substance Use Topics  . Smoking status: Current Some Day Smoker    Types: Cigars  . Smokeless tobacco: Never Used  . Alcohol Use: Yes     Comment: once a month    OB History   Grav Para Term Preterm Abortions TAB SAB Ect Mult Living                  Review of Systems  Skin: Positive for wound.  All other systems reviewed and are negative.    Allergies  Other  Home Medications   Current Outpatient Rx  Name  Route  Sig  Dispense  Refill  . acetaZOLAMIDE (DIAMOX) 250 MG tablet   Oral   Take 250 mg by mouth daily.         Marland Kitchen albuterol (PROVENTIL HFA;VENTOLIN HFA) 108 (90 BASE) MCG/ACT inhaler   Inhalation   Inhale 2 puffs into the lungs every 6 (six) hours as needed. For shortness of breath or wheezing         . albuterol (PROVENTIL HFA;VENTOLIN HFA) 108 (90 BASE) MCG/ACT inhaler   Inhalation   Inhale 1-2 puffs into the lungs every 6 (six) hours as needed for wheezing.   1  Inhaler   0   . albuterol (PROVENTIL HFA;VENTOLIN HFA) 108 (90 BASE) MCG/ACT inhaler   Inhalation   Inhale 1-2 puffs into the lungs every 6 (six) hours as needed for wheezing.   1 Inhaler   0   . citalopram (CELEXA) 10 MG tablet   Oral   Take 10 mg by mouth daily.         . fluticasone (FLONASE) 50 MCG/ACT nasal spray   Nasal   Place 1 spray into the nose daily.         . human chorionic gonadotropin (PREGNYL/NOVAREL) 10000 UNITS injection   Intramuscular   Inject 10,000 Units into the muscle every 7 (seven) days.         . montelukast (SINGULAIR) 10 MG tablet   Oral   Take 10 mg by mouth at bedtime.         . phentermine 37.5 MG capsule   Oral   Take 37.5 mg by mouth every morning.          . predniSONE (DELTASONE) 10 MG tablet      5,4,3,2,1 taper   15 tablet   0   . predniSONE (DELTASONE)  50 MG tablet   Oral   Take 1 tablet (50 mg total) by mouth daily.   5 tablet   0     BP 105/74  Pulse 86  Temp(Src) 98.6 F (37 C) (Oral)  Resp 16  Ht 5' 5.5" (1.664 m)  Wt 221 lb (100.245 kg)  BMI 36.2 kg/m2  SpO2 100%  LMP 01/18/2013  Physical Exam  Nursing note and vitals reviewed. Constitutional: She appears well-developed and well-nourished.  Musculoskeletal: She exhibits tenderness.  3mm punture wound at base of proximal phalanx left thumb. Decreased extension,   I can see tendon through wound  Neurological: She is alert.  Skin: Skin is warm.  Psychiatric: She has a normal mood and affect.    ED Course  Procedures (including critical care time)  Labs Reviewed - No data to display No results found.   1. Laceration of thumb with tendon involvement       MDM    steristrip to laceration,  Finger splint.   I spoke to Dr. Orlan Leavens who will see pt on Tuesday in the office for evaluation.   Pt given rx for pain.      Lonia Skinner [place], [place] 01/31/13 2103

## 2013-01-31 NOTE — ED Provider Notes (Signed)
Medical screening examination/treatment/procedure(s) were performed by non-physician practitioner and as supervising physician I was immediately available for consultation/collaboration.   Richardean Canal, MD 01/31/13 [phone]

## 2013-01-31 NOTE — ED Notes (Signed)
Pt states she cut her left thumb while washing dishes. Was at Urgent Care but they feel she "cut her tendon" Moves thumb. Feels touch. Cap refill < 3 sec. Approx 1 cm lac noted. Bleeding controlled.

## 2013-03-11 ENCOUNTER — Encounter: Payer: Self-pay | Admitting: Internal Medicine

## 2013-03-12 ENCOUNTER — Ambulatory Visit (INDEPENDENT_AMBULATORY_CARE_PROVIDER_SITE_OTHER): Payer: BC Managed Care – PPO | Admitting: Internal Medicine

## 2013-03-12 ENCOUNTER — Encounter: Payer: Self-pay | Admitting: Internal Medicine

## 2013-03-12 VITALS — BP 110/70 | HR 77 | Temp 97.8°F | Ht 65.5 in | Wt 220.6 lb

## 2013-03-12 DIAGNOSIS — F17200 Nicotine dependence, unspecified, uncomplicated: Secondary | ICD-10-CM

## 2013-03-12 DIAGNOSIS — J45909 Unspecified asthma, uncomplicated: Secondary | ICD-10-CM

## 2013-03-12 MED ORDER — BUDESONIDE-FORMOTEROL FUMARATE 160-4.5 MCG/ACT IN AERO: INHALATION_SPRAY | RESPIRATORY_TRACT | Status: DC

## 2013-03-12 NOTE — Progress Notes (Signed)
  Subjective:    Patient ID: Nancy Buchanan, female    DOB: Jan 10, 1981  MRN: [id number]  HPI  59 yobf smoker reports asthma in middle school used inhaler prn until college then stopped using inhalers p college then started smoking age 32 with no need any inhalers until 2012 and then problems with freq severe flares so referred 03/12/2013 by Dr Justice Britain for asthma eval.   03/12/2013 1st pulmonary cc poorly controlled asthma with list er trip 12/2012 and 5 h nebulizer but no admit = better at ov but still sob with strenous activity like steps. Min cough> clumps white, albuterol seems to help and using once daily despite advair/ singular.  No obvious daytime variabilty or assoc chronic cough or cp or chest tightness, subjective wheeze overt sinus or hb symptoms. No unusual exp hx or h/o childhood pna/ asthma or premature birth to his knowledge.   Sleeping ok without nocturnal  or early am exacerbation  of respiratory  c/o's or need for noct saba. Also denies any obvious fluctuation of symptoms with weather or environmental changes or other aggravating or alleviating factors except as outlined above.   Review of Systems  Constitutional: Negative for fever, chills and unexpected weight change.  HENT: Positive for congestion and sneezing. Negative for ear pain, nosebleeds, sore throat, rhinorrhea, trouble swallowing, dental problem, voice change, postnasal drip and sinus pressure.   Eyes: Negative for visual disturbance.  Respiratory: Positive for cough and shortness of breath. Negative for choking.   Cardiovascular: Negative for chest pain and leg swelling.  Gastrointestinal: Negative for vomiting, abdominal pain and diarrhea.  Genitourinary: Negative for difficulty urinating.  Musculoskeletal: Negative for arthralgias.  Skin: Negative for rash.  Neurological: Negative for tremors, syncope and headaches.  Hematological: Does not bruise/bleed easily.       Objective:   Physical Exam  Wt  Readings from Last 3 Encounters:  03/12/13 220 lb 9.6 oz (100.064 kg)  01/31/13 221 lb (100.245 kg)  02/09/12 213 lb (96.616 kg)    HEENT: nl dentition, turbinates, and orophanx. Nl external ear canals without cough reflex   NECK :  without JVD/Nodes/TM/ nl carotid upstrokes bilaterally   LUNGS: no acc muscle use, insp and exp wheeze scattered bilaterally  CV:  RRR  no s3 or murmur or increase in P2, no edema   ABD:  soft and nontender with nl excursion in the supine position. No bruits or organomegaly, bowel sounds nl  MS:  warm without deformities, calf tenderness, cyanosis or clubbing  SKIN: warm and dry without lesions    NEURO:  alert, approp, no deficits   pcxr 12/25/12  Lower lung volumes with increased accentuation of the interstitial  markings, likely secondary to reactive airways disease or  superimposed viral infection. No evidence of pneumonia.     Assessment & Plan:

## 2013-03-12 NOTE — Patient Instructions (Addendum)
Symbicort 160 Take 2 puffs first thing in am and then another 2 puffs about 12 hours later and stop advair  Only use your albuterol (proaire) as a rescue medication to be used if you can't catch your breath by resting or doing a relaxed purse lip breathing pattern. The less you use it, the better it will work when you need it.   The key is to stop smoking completely before smoking completely stops you - this is the most important aspect of your care!  Please schedule a follow up office visit in 4 weeks, sooner if needed with pft's on return

## 2013-03-13 ENCOUNTER — Encounter: Payer: Self-pay | Admitting: Internal Medicine

## 2013-03-13 DIAGNOSIS — F17210 Nicotine dependence, cigarettes, uncomplicated: Secondary | ICD-10-CM | POA: Insufficient documentation

## 2013-03-13 DIAGNOSIS — J4530 Mild persistent asthma, uncomplicated: Secondary | ICD-10-CM | POA: Insufficient documentation

## 2013-03-13 NOTE — Assessment & Plan Note (Addendum)
DDX of  difficult airways managment all start with A and  include Adherence, Ace Inhibitors, Acid Reflux, Active Sinus Disease, Alpha 1 Antitripsin deficiency, Anxiety masquerading as Airways dz,  ABPA,  allergy(esp in young), Aspiration (esp in elderly), Adverse effects of DPI,  Active smokers, plus two Bs  = Bronchiectasis and Beta blocker use..and one C= CHF  Adherence is always the initial "prime suspect" and is a multilayered concern that requires a "trust but verify" approach in every patient - starting with knowing how to use medications, especially inhalers, correctly, keeping up with refills and understanding the fundamental difference between maintenance and prns vs those medications only taken for a very short course and then stopped and not refilled. The proper method of use, as well as anticipated side effects, of a metered-dose inhaler are discussed and demonstrated to the patient. Improved effectiveness after extensive coaching during this visit to a level of approximately  75% so try symbicort 160 2bid  ? Adverse effect of advair > not clearly a problem but note it's not working anyway   Active smoking a huge concern > discussed separately

## 2013-03-13 NOTE — Assessment & Plan Note (Signed)

## 2013-04-13 ENCOUNTER — Ambulatory Visit (INDEPENDENT_AMBULATORY_CARE_PROVIDER_SITE_OTHER): Payer: BC Managed Care – PPO | Admitting: Internal Medicine

## 2013-04-13 ENCOUNTER — Encounter: Payer: Self-pay | Admitting: *Deleted

## 2013-04-13 ENCOUNTER — Encounter: Payer: Self-pay | Admitting: Internal Medicine

## 2013-04-13 VITALS — BP 108/80 | HR 104 | Temp 99.2°F | Ht 66.0 in | Wt 227.0 lb

## 2013-04-13 DIAGNOSIS — F17200 Nicotine dependence, unspecified, uncomplicated: Secondary | ICD-10-CM

## 2013-04-13 DIAGNOSIS — J45909 Unspecified asthma, uncomplicated: Secondary | ICD-10-CM

## 2013-04-13 LAB — PULMONARY FUNCTION TEST

## 2013-04-13 NOTE — Patient Instructions (Addendum)
Symbicort 160 Take 2 puffs first thing in am and then another 2 puffs about 12 hours later.   Only use your albuterol as a rescue medication to be used if you can't catch your breath by resting or doing a relaxed purse lip breathing pattern. The less you use it, the better it will work when you need it.  Can use it up to every 4 hours but goal is less than twice a week  Please schedule a follow up visit in 3 months but call sooner if needed

## 2013-04-13 NOTE — Progress Notes (Signed)
PFT done today. 

## 2013-04-13 NOTE — Progress Notes (Signed)
  Subjective:    Patient ID: Nancy Buchanan, female    DOB: Mar 29, 1981  MRN: [id number]  HPI  89 yobf smoker reports asthma in middle school used inhaler prn until college then stopped using inhalers p college then started smoking age 32 with no need any inhalers until 2012 and then problems with freq severe flares so referred 03/12/2013 by Dr Nancy Buchanan for asthma eval.   03/12/2013 1st pulmonary cc poorly controlled asthma with list er trip 12/2012 and 5 h nebulizer but no admit = better at ov but still sob with strenous activity like steps. Min cough> clumps white, albuterol seems to help and using once daily despite advair/ singular. rec Symbicort 160 Take 2 puffs first thing in am and then another 2 puffs about 12 hours later and stop advair Only use your albuterol (proaire) as a rescue medication  The key is to stop smoking completely    04/13/2013 f/u ov/Nancy Buchanan re asthma quit smoking completely  Chief Complaint  Patient presents with  . Office Visit    PFT Today-------Pt. reports that her breathing is doing better.  Pt.  denies any sob, coughing, wheezing or tightness in chest.    No need at all for saba though not aerobically active   No obvious daytime variabilty or assoc chronic cough or cp or chest tightness, subjective wheeze overt sinus or hb symptoms. No unusual exp hx or h/o childhood pna/ asthma or premature birth to his knowledge.   Sleeping ok without nocturnal  or early am exacerbation  of respiratory  c/o's or need for noct saba. Also denies any obvious fluctuation of symptoms with weather or environmental changes or other aggravating or alleviating factors except as outlined above.  Current Medications, Allergies, Past Medical History, Past Surgical History, Family History, and Social History were reviewed in [employer] record.  ROS  The following are not active complaints unless bolded sore throat, dysphagia, dental problems, itching,  sneezing,  nasal congestion or excess/ purulent secretions, ear ache,   fever, chills, sweats, unintended wt loss, pleuritic or exertional cp, hemoptysis,  orthopnea pnd or leg swelling, presyncope, palpitations, heartburn, abdominal pain, anorexia, nausea, vomiting, diarrhea  or change in bowel or urinary habits, change in stools or urine, dysuria,hematuria,  rash, arthralgias, visual complaints, headache, numbness weakness or ataxia or problems with walking or coordination,  change in mood/affect or memory.           Objective:   Physical Exam  04/13/2013    227 Wt Readings from Last 3 Encounters:  03/12/13 220 lb 9.6 oz (100.064 kg)  01/31/13 221 lb (100.245 kg)  02/09/12 213 lb (96.616 kg)    HEENT: nl dentition, turbinates, and orophanx. Nl external ear canals without cough reflex   NECK :  without JVD/Nodes/TM/ nl carotid upstrokes bilaterally   LUNGS: no acc muscle use, clear to A and P  CV:  RRR  no s3 or murmur or increase in P2, no edema   ABD:  soft and nontender with nl excursion in the supine position. No bruits or organomegaly, bowel sounds nl  MS:  warm without deformities, calf tenderness, cyanosis or clubbing      pcxr 12/25/12  Lower lung volumes with increased accentuation of the interstitial  markings, likely secondary to reactive airways disease or  superimposed viral infection. No evidence of pneumonia.     Assessment & Plan:

## 2013-04-14 ENCOUNTER — Encounter: Payer: Self-pay | Admitting: Internal Medicine

## 2013-04-14 NOTE — Assessment & Plan Note (Addendum)
-   hfa 75% p coaching 03/12/13 - PFT's 04/13/13 > wnl  All goals of chronic asthma control met including optimal function and elimination of symptoms with minimal need for rescue therapy.  Contingencies discussed in full including contacting this office immediately if not controlling the symptoms using the rule of two's.       Each maintenance medication was reviewed in detail including most importantly the difference between maintenance and as needed and under what circumstances the prns are to be used.  Please see instructions for details which were reviewed in writing and the patient given a copy.

## 2013-04-14 NOTE — Assessment & Plan Note (Signed)
-   Limits of effective care reviewed 03/12/13 - Reports quit effective 03/13/13  Congratulated on success to date.  I reviewed the Flethcher curve with patient that basically indicates  if you quit smoking when your best day FEV1 is still well preserved (and hers is now normal!) it is highly unlikely you will progress to severe disease and informed the patient there was no medication on the market that has proven to change the curve or the likelihood of progression.  Therefore stopping smoking and maintaining abstinence is the most important aspect of care, not choice of inhalers or for that matter, doctors.

## 2013-07-05 ENCOUNTER — Encounter: Payer: Self-pay | Admitting: Dietician

## 2013-07-05 ENCOUNTER — Encounter: Payer: BC Managed Care – PPO | Attending: Family Medicine | Admitting: Dietician

## 2013-07-05 VITALS — Ht 66.0 in | Wt 228.2 lb

## 2013-07-05 DIAGNOSIS — Z713 Dietary counseling and surveillance: Secondary | ICD-10-CM | POA: Insufficient documentation

## 2013-07-05 DIAGNOSIS — E669 Obesity, unspecified: Secondary | ICD-10-CM | POA: Insufficient documentation

## 2013-07-05 NOTE — Patient Instructions (Addendum)
Eat small meals throughout the day aim for meals with 30 g carbohydrate.  Pair carbohydrate foods with protein to curb hunger late after work for snacks. Add protein such as chicken or reduced fat cheese to side salad.   Read labels, especially salad dressing, and look for lower calorie and fat choices.  Remember to drink water throughout day.   Set aside time on the weekend to plan meals for the week.  Aim for 7-8 hours of sleep per night.

## 2013-07-05 NOTE — Progress Notes (Signed)
  Medical Nutrition Therapy:  Appt start time: 1630 end time:  1730.   Assessment:  Primary concerns today: Patient trying to lose weight. Tried phentermine September 2013 and helped her lose 10-15 pounds. Discontinued d/t health concerns. Reports trying to lose weight on and off for two years. Currently at highest weight after quitting smoking two months ago. Normal weight is 200-210, and has steadily gone up in past year. Weight loss goal is 180 pounds.   Works second shift at [employer], wakes up at 8:30 AM and goes to sleep 1 AM and needs medication to sleep. Used to go out to eat a lot and eat "emotionally" but now eats a lot less than used to. Been working out consistently for one month, started one year ago.   Elysia states that she is a stressed person, has difficulty sleeping, and has previously seen a therapist. She wished to harm herself 6 months ago, but that her anti-depressants are now working and she no longer feels that way. She declined recommendations for a new therapist.  MEDICATIONS: See list   DIETARY INTAKE:  24-hr recall:  B ( 10 AM): sometimes nothing, protein shake approximately 2 cups L (2 PM): Side salad from [employer] with water  D (6 PM): main meal, tries to cooked BBQ chicken legs, corn, and greens with water Snk (11 PM): 2 hot dogs Beverages:half gallon with crystal light throughout day  Usual physical activity: 5 days week, cardio elliptical for one hour  Estimated energy needs for weight loss: 1200-1400 calories 150 g carbohydrates 75 g protein 35 g fat  Progress Towards Goal(s):  In progress.   Nutritional Diagnosis:  NB-1.1 Food and nutrition-related knowledge deficit As related to obesity.  As evidenced by gradual weight gain in past year..    Intervention:  Nutrition counseling provided including:  Eat small meals throughout the day aim for meals with 30 g carbohydrate.  Pair carbohydrate foods with protein to curb hunger. Add protein such  as chicken or reduced fat cheese to side salad.   Read labels, especially salad dressing, and look for lower calorie and fat choices.  Remember to drink water throughout day.   Set aside time on the weekend to plan meals for the week.  Aim for 7-8 hours of sleep per night.   Handouts given during visit include:  Meal planning card  Protein and CHO snacks  Living with diabetes book  Monitoring/Evaluation:  Dietary intake, exercise, and body weight in 6 week(s).

## 2013-08-16 ENCOUNTER — Ambulatory Visit: Payer: BC Managed Care – PPO | Admitting: Dietician

## 2013-09-01 ENCOUNTER — Telehealth: Payer: Self-pay | Admitting: *Deleted

## 2013-09-01 NOTE — Telephone Encounter (Signed)
Left reminder message about appointment on 09-02-13

## 2013-09-02 ENCOUNTER — Ambulatory Visit (INDEPENDENT_AMBULATORY_CARE_PROVIDER_SITE_OTHER): Payer: BC Managed Care – PPO | Admitting: Nurse Practitioner

## 2013-09-02 ENCOUNTER — Encounter: Payer: Self-pay | Admitting: Nurse Practitioner

## 2013-09-02 VITALS — BP 92/64 | HR 80 | Ht 66.0 in | Wt 219.0 lb

## 2013-09-02 DIAGNOSIS — H4710 Unspecified papilledema: Secondary | ICD-10-CM

## 2013-09-02 NOTE — Progress Notes (Signed)
I have read the note, and I agree with the clinical assessment and plan.  Adriahna Shearman KEITH   

## 2013-09-02 NOTE — Patient Instructions (Addendum)
Patient to continue Topamax at current dose does not need refills Followup with ophthalmologist Dr. Lucretia Roers Continue weight-loss program and healthy diet Followup in 6 months

## 2013-09-02 NOTE — Progress Notes (Signed)
[facility]  PATIENT: Nancy Buchanan DOB: August 15, 1981   REASON FOR VISIT: Followup for pseudotumor cerebri    HISTORY OF PRESENT ILLNESS: Nancy Buchanan is a 32 year old right-handed black female with a history of obesity. She currently weighs 218  The patient went to her eye doctor on 04 November 2011 for a routine eye examination. The patient had been evaluated 2 years prior by the same physician. The patient was noted to have onset of papilledema. She was initially evaluated by Nancy Buchanan 12/26/11.  The patient has had some problems with headaches occurring on average 2 times a week, but the patient has not had any headaches over the last 3 months. The patient denies any neck stiffness, and denies any hearing changes or muffled hearing. The patient has not had any significant visual blurring or double vision. The patient has had 2 episodes of transient visual blurring with standing. The patient has not had any numbness or weakness of the face, arms, or legs. The patient denies any balance issues or problems controlling the bowels or the bladder.  She had MRI of the brain 01/08/12 which was normal. She had LP 01/29/12 with opening pressure of 36cm. She was then placed on Acetazolamide but initially had trouble taking the medication. She reduced dose to daily for 2 weeks then increased to BID with side effects.  She denies any severe headaches, any visual disturbance or double vision. She denies any numbness, or focal weakness.  09/11/12: Returns for followup. She cut her Acetazolamide to just daily, she takes it at night, she says it causes dizziness and drowsiness and she cannot work taking the drug. She claims she saw Nancy Buchanan a few weeks ago and was told her visual fields were stable. She denies headache or visual problems.   09/02/13.Patient returns for followup. She is currently on Topamax 100mg  daily.  She had problems tolerating the Acetazolamide. She denies side effects. She  denies visual symptoms or headaches. She see Nancy Buchanan opthamologist every 6 months. She continues to exercise, claims she is on low-carb diet.Marland Kitchen   REVIEW OF SYSTEMS: Full 14 system review of systems performed and notable only for:  Constitutional: N/A  Cardiovascular: N/A  Ear/Nose/Throat: N/A  Skin: N/A  Eyes: N/A  Respiratory: Shortness of breath, cough, wheezing  Gastroitestinal: N/A  Hematology/Lymphatic: N/A  Endocrine: N/A Musculoskeletal:N/A  Allergy/Immunology: N/A  Neurological: N/A Psychiatric: Depression, insomnia  ALLERGIES: Allergies  Allergen Reactions  . Other Anaphylaxis    peanuts    HOME MEDICATIONS: Outpatient Prescriptions Prior to Visit  Medication Sig Dispense Refill  . albuterol (PROAIR HFA) 108 (90 BASE) MCG/ACT inhaler Inhale 2 puffs into the lungs every 6 (six) hours as needed.      . budesonide-formoterol (SYMBICORT) 160-4.5 MCG/ACT inhaler Take 2 puffs first thing in am and then another 2 puffs about 12 hours later.  1 Inhaler  12  . cholecalciferol (VITAMIN D) 1000 UNITS tablet Take 1,000 Units by mouth daily.      . citalopram (CELEXA) 10 MG tablet Take 10 mg by mouth daily.      . montelukast (SINGULAIR) 10 MG tablet Take 10 mg by mouth at bedtime.      . topiramate (TOPAMAX) 100 MG tablet Take 100 mg by mouth daily.       No facility-administered medications prior to visit.    PAST MEDICAL HISTORY: Past Medical History  Diagnosis Date  . Asthma   . Hyperlipidemia   . Intracranial hypertension  PAST SURGICAL HISTORY: Past Surgical History  Procedure Laterality Date  . Cholecystectomy  2012    FAMILY HISTORY: Family History  Problem Relation Age of Onset  . Diabetes Father   . Breast cancer Paternal Aunt   . Hyperlipidemia Other   . Sleep apnea Other     SOCIAL HISTORY: History   Social History  . Marital Status: Single    Spouse Name: N/A    Number of Children: 0  . Years of Education: College degree    Occupational History  . Customer Service Time [employer]   Social History Main Topics  . Smoking status: Former Smoker    Types: Cigars    Quit date: 03/13/2013  . Smokeless tobacco: Never Used     Comment: pt. stopped smoking cigars as of last visit  . Alcohol Use: Yes     Comment: social   . Drug Use: No  . Sexual Activity: Yes    Birth Control/ Protection: None   Other Topics Concern  . Not on file   Social History Narrative  . No narrative on file     PHYSICAL EXAM  Filed Vitals:   09/02/13 1126  BP: 92/64  Pulse: 80  Height: 5\' 6"  (1.676 m)  Weight: 219 lb (99.338 kg)   Body mass index is 35.36 kg/(m^2).  Generalized: Well developed, obese female in no acute distress  Head: normocephalic and atraumatic,. Oropharynx benign  Neck: Supple, no carotid bruits  Cardiac: Regular rate rhythm, no murmur  Musculoskeletal: No deformity   Neurological examination   Mentation: Alert oriented to time, place, history taking. Follows all commands speech and language fluent  Cranial nerve II-XII: Fundoscopic exam reveals sharp disc margins.Visual acuity 20/20 bilaterally. Pupils were equal round reactive to light extraocular movements were full, visual field were full on confrontational test. Facial sensation and strength were normal. hearing was intact to finger rubbing bilaterally. Uvula tongue midline. head turning and shoulder shrug and were normal and symmetric.Tongue protrusion into cheek strength was normal. Motor: normal bulk and tone, full strength in the BUE, BLE, fine finger movements normal, no pronator drift. No focal weakness Sensory: normal and symmetric to light touch, pinprick, and  vibration  Coordination: finger-nose-finger, heel-to-shin bilaterally, no dysmetria Reflexes: Brachioradialis 2/2, biceps 2/2, triceps 2/2, patellar 2/2, Achilles 2/2, plantar responses were flexor bilaterally. Gait and Station: Rising up from seated position without assistance,  normal stance,  moderate stride, good arm swing, smooth turning, able to perform tiptoe, and heel walking without difficulty. Tandem gait normal  DIAGNOSTIC DATA (LABS, IMAGING, TESTING) - I reviewed patient records, labs, notes, testing and imaging myself where available.  Lab Results  Component Value Date   WBC 11.4* 12/25/2012   HGB 11.2* 12/25/2012   HCT 35.7* 12/25/2012   MCV 85.4 12/25/2012   PLT 342 12/25/2012    ASSESSMENT AND PLAN  32 y.o. year old female  has a past medical history of Asthma; Hyperlipidemia; and Intracranial hypertension. here for followup. She remains on Topamax without side effects she denies headaches, no neck stiffness or visual disturbance. She sees her ophthalmologist every 6 months, and Nancy Buchanan Patient to continue Topamax at current dose does not need refills Followup with ophthalmologist Nancy Buchanan Continue weight-loss program and healthy diet Followup in 6 months  Nilda Riggs, [facility], [facility], APRN  [facility] [address] [place], [place] [address] [phone]

## 2013-10-11 ENCOUNTER — Telehealth: Payer: Self-pay | Admitting: Nurse Practitioner

## 2013-10-11 NOTE — Telephone Encounter (Signed)
Patient currently has refills on the Topamax, but would like the prescription called in for a 90 day supply.

## 2013-10-12 MED ORDER — TOPIRAMATE 100 MG PO TABS: 100.0000 mg | ORAL_TABLET | Freq: Every day | ORAL | Status: DC

## 2013-10-12 NOTE — Telephone Encounter (Signed)
RX sent

## 2013-12-29 ENCOUNTER — Emergency Department (HOSPITAL_COMMUNITY): Payer: Self-pay

## 2013-12-29 ENCOUNTER — Observation Stay (HOSPITAL_COMMUNITY)
Admission: EM | Admit: 2013-12-29 | Discharge: 2013-12-30 | Disposition: A | Discharge status: Home / Self Care | Payer: BC Managed Care – PPO | Attending: Internal Medicine | Admitting: Internal Medicine

## 2013-12-29 DIAGNOSIS — J329 Chronic sinusitis, unspecified: Secondary | ICD-10-CM

## 2013-12-29 DIAGNOSIS — Z91199 Patient's noncompliance with other medical treatment and regimen due to unspecified reason: Secondary | ICD-10-CM | POA: Insufficient documentation

## 2013-12-29 DIAGNOSIS — Z87891 Personal history of nicotine dependence: Secondary | ICD-10-CM | POA: Insufficient documentation

## 2013-12-29 DIAGNOSIS — J019 Acute sinusitis, unspecified: Secondary | ICD-10-CM | POA: Insufficient documentation

## 2013-12-29 DIAGNOSIS — E785 Hyperlipidemia, unspecified: Secondary | ICD-10-CM | POA: Insufficient documentation

## 2013-12-29 DIAGNOSIS — Z9119 Patient's noncompliance with other medical treatment and regimen: Secondary | ICD-10-CM | POA: Insufficient documentation

## 2013-12-29 DIAGNOSIS — J45909 Unspecified asthma, uncomplicated: Secondary | ICD-10-CM | POA: Insufficient documentation

## 2013-12-29 DIAGNOSIS — G8250 Quadriplegia, unspecified: Secondary | ICD-10-CM

## 2013-12-29 DIAGNOSIS — R29818 Other symptoms and signs involving the nervous system: Principal | ICD-10-CM | POA: Insufficient documentation

## 2013-12-29 DIAGNOSIS — I674 Hypertensive encephalopathy: Secondary | ICD-10-CM | POA: Insufficient documentation

## 2013-12-29 DIAGNOSIS — R29898 Other symptoms and signs involving the musculoskeletal system: Secondary | ICD-10-CM | POA: Insufficient documentation

## 2013-12-29 DIAGNOSIS — H02409 Unspecified ptosis of unspecified eyelid: Secondary | ICD-10-CM | POA: Insufficient documentation

## 2013-12-29 LAB — BASIC METABOLIC PANEL
BUN: 9 mg/dL (ref 6–23)
CHLORIDE: 104 meq/L (ref 96–112)
CO2: 25 meq/L (ref 19–32)
CREATININE: 0.75 mg/dL (ref 0.50–1.10)
Calcium: 9.2 mg/dL (ref 8.4–10.5)
GFR calc non Af Amer: >90 mL/min (ref >90)
GLUCOSE: 88 mg/dL (ref 70–99)
POTASSIUM: 4 meq/L (ref 3.7–5.3)
Sodium: 142 mEq/L (ref 137–147)

## 2013-12-29 LAB — CBC WITH DIFFERENTIAL/PLATELET
BASOS PCT: 0 % (ref 0–1)
Basophils Absolute: 0 10*3/uL (ref 0.0–0.1)
Eosinophils Absolute: 0.6 10*3/uL (ref 0.0–0.7)
Eosinophils Relative: 5 % (ref 0–5)
HEMATOCRIT: 37.1 % (ref 36.0–46.0)
HEMOGLOBIN: 11.7 g/dL — AB (ref 12.0–15.0)
LYMPHS ABS: 3.3 10*3/uL (ref 0.7–4.0)
LYMPHS PCT: 30 % (ref 12–46)
MCH: 27.2 pg (ref 26.0–34.0)
MCHC: 31.5 g/dL (ref 30.0–36.0)
MCV: 86.3 fL (ref 78.0–100.0)
MONO ABS: 0.9 10*3/uL (ref 0.1–1.0)
MONOS PCT: 8 % (ref 3–12)
NEUTROS ABS: 6.4 10*3/uL (ref 1.7–7.7)
NEUTROS PCT: 57 % (ref 43–77)
Platelets: 329 10*3/uL (ref 150–400)
RBC: 4.3 MIL/uL (ref 3.87–5.11)
RDW: 14.1 % (ref 11.5–15.5)
WBC: 11.2 10*3/uL — ABNORMAL HIGH (ref 4.0–10.5)

## 2013-12-29 LAB — URINALYSIS, ROUTINE W REFLEX MICROSCOPIC
BILIRUBIN URINE: NEGATIVE
GLUCOSE, UA: NEGATIVE mg/dL
HGB URINE DIPSTICK: NEGATIVE
KETONES UR: NEGATIVE mg/dL
Leukocytes, UA: NEGATIVE
NITRITE: NEGATIVE
PH: 6.5 (ref 5.0–8.0)
Protein, ur: NEGATIVE mg/dL
SPECIFIC GRAVITY, URINE: 1.027 (ref 1.005–1.030)
Urobilinogen, UA: 1 mg/dL (ref 0.0–1.0)

## 2013-12-29 NOTE — ED Provider Notes (Signed)
CSN: [id number]     Arrival date & time 12/29/13  1929 History   First MD Initiated Contact with Patient 12/29/13 1958     Chief Complaint  Patient presents with  . Extremity Weakness  . Dizziness   (Consider location/radiation/quality/duration/timing/severity/associated sxs/prior Treatment) HPI History provided by pt.   Pt has h/o intracranial hypertension for which she takes topamax and has been non-compliant the past week.  Otherwise healthy and ICP has never been symptomatic in the past; diagnosed on routine eye exam.  Had an episode of lightheadedness while walking at work at 7pm, went back to her desk and while sitting, she developed stiffness w/ inability to move as well as paresthesias of all four extremities.  Lightheadedness resolved but extremity sx have persisted and since she has been in the ED, her friends have noticed drooping of R eyelid.  She had mildly blurred vision while lightheaded, but no other visual symptoms, dizziness, dysarthria, dysphagia, confusion, headache, neck/back pain, urinary/bowel incontinence.  No recent medication changes, head trauma, illness, drug abuse.  Develops anxiety with asthma attacks only, and usually presents w/ tachycardia and SOB.  Has never had these sx in the past.  Past Medical History  Diagnosis Date  . Asthma   . Hyperlipidemia   . Intracranial hypertension    Past Surgical History  Procedure Laterality Date  . Cholecystectomy  2012   Family History  Problem Relation Age of Onset  . Diabetes Father   . Breast cancer Paternal Aunt   . Hyperlipidemia Other   . Sleep apnea Other    History  Substance Use Topics  . Smoking status: Former Smoker    Types: Cigars    Quit date: 03/13/2013  . Smokeless tobacco: Never Used     Comment: pt. stopped smoking cigars as of last visit  . Alcohol Use: Yes     Comment: social    OB History   Grav Para Term Preterm Abortions TAB SAB Ect Mult Living                 Review of Systems  All  other systems reviewed and are negative.    Allergies  Other  Home Medications   Current Outpatient Rx  Name  Route  Sig  Dispense  Refill  . albuterol (PROAIR HFA) 108 (90 BASE) MCG/ACT inhaler   Inhalation   Inhale 2 puffs into the lungs every 6 (six) hours as needed.         . budesonide-formoterol (SYMBICORT) 160-4.5 MCG/ACT inhaler      Take 2 puffs first thing in am and then another 2 puffs about 12 hours later.   1 Inhaler   12   . cholecalciferol (VITAMIN D) 1000 UNITS tablet   Oral   Take 1,000 Units by mouth daily.         . citalopram (CELEXA) 10 MG tablet   Oral   Take 10 mg by mouth daily.         . montelukast (SINGULAIR) 10 MG tablet   Oral   Take 10 mg by mouth at bedtime.         . topiramate (TOPAMAX) 100 MG tablet   Oral   Take 1 tablet (100 mg total) by mouth daily.   90 tablet   3    BP 119/69  Pulse 81  Temp(Src) 98.5 F (36.9 C) (Oral)  Resp 22  SpO2 100% Physical Exam  Nursing note and vitals reviewed. Constitutional: She is oriented  to person, place, and time. She appears well-developed and well-nourished. No distress.  HENT:  Head: Normocephalic and atraumatic.  Eyes:  Normal appearance  Neck: Normal range of motion.  Cardiovascular: Normal rate and regular rhythm.   Pulmonary/Chest: Effort normal and breath sounds normal. No respiratory distress.  Genitourinary:  nml rectal tone  Musculoskeletal: She exhibits no edema and no tenderness.  Lumbar spine non-tender.    Neurological: She is alert and oriented to person, place, and time.  CN 3-12 intact, w/ exception of right ptosis.  Pt is however able to keep eyelid closed against my resistance.  Symmetric eye brow raise and smile.  No sensory deficits.  3/5 strength bilateral upper extremities.  0/5  lower extremity strength.  No past pointing.   Skin: Skin is warm and dry. No rash noted.  Psychiatric: She has a normal mood and affect. Her behavior is normal.    ED  Course  Procedures (including critical care time) Labs Review Labs Reviewed  CBC WITH DIFFERENTIAL - Abnormal; Notable for the following:    WBC 11.2 (*)    Hemoglobin 11.7 (*)    All other components within normal limits  BASIC METABOLIC PANEL  URINALYSIS, ROUTINE W REFLEX MICROSCOPIC  PREGNANCY, URINE   Imaging Review Ct Head Wo Contrast  12/29/2013   CLINICAL DATA:  Sudden onset of dizziness with weakness and numbness in all extremities  EXAM: CT HEAD WITHOUT CONTRAST  TECHNIQUE: Contiguous axial images were obtained from the base of the skull through the vertex without intravenous contrast.  COMPARISON:  None available  FINDINGS: There is no acute intracranial hemorrhage or infarct. No mass lesion or midline shift. Gray-white matter differentiation is well maintained. Ventricles are normal in size without evidence of hydrocephalus. CSF containing spaces are within normal limits. No extra-axial fluid collection.  The calvarium is intact.  Orbital soft tissues are within normal limits.  Air-fluid levels are present within the maxillary sinuses bilaterally as well as the left sphenoid sinus. There is scattered opacification of the anterior posterior ethmoidal air cells bilaterally.  No mastoid effusion.  Scalp soft tissues are unremarkable.  IMPRESSION: 1. Normal head CT with no acute intracranial process. 2. Acute sinusitis involving the bilateral maxillary sinuses, left sphenoid sinus, and ethmoidal air cells.   Electronically Signed   By: Rise Mu M.D.   On: 12/29/2013 22:34    EKG Interpretation   None       MDM   1. Quadriparesis    33yo F w/ ICP, non-compliant w/ topamax over past week, otherwise healthy, presents w/ self-limited episode of lightheadedness at 7pm today, followed by paralysis and paresthesias all four extremities, currently stable.  On exam, VS w/in nml range, calm, A&Ox4, visible ptosis on R but able to hold eyelids closed against resistance and CN  otherwise intact, decreased but symmetric strength bilateral upper extremities, paraparesis, no sensory deficits.  Labs and CT head pending.    Labs unremarkable and CT head non-acute.  Pt re-examined, ptosis resolved, full active ROM upper extremities, active ROM toes but persistent 0/5 strength proximal muscle groups BLE, nml finger-to-nose.  Consulted neurohospitalist who will see patient but recommends CT cervical and thoracic spine.  This is not intracranial process. 11:29 PM   Dr. Amada Jupiter evaluated patient and recommended MRI of brain and cervical spine, though symptoms are most likely psychogenic.  Plan discussed w/ patient.  On repeat exam, full ROM all four extremities, though persistently weak BLE.  She is not ambulatory at  this time. [facility] consulted to admission.  3:17 AM     Otilio Miu, PA-C 12/30/13 [phone]

## 2013-12-29 NOTE — ED Notes (Signed)
EMS-pt reports sudden onset of dizziness, weakness in all extremities, and numbness in all extremities after standing up. Pt reports she still has numbness in all extremities and weakness. Denies dizziness. On EMS exam weakness is equal throughout. Vitals WNL. CBG 74. 20g(R)AC.

## 2013-12-30 ENCOUNTER — Encounter (HOSPITAL_COMMUNITY): Payer: Self-pay | Admitting: *Deleted

## 2013-12-30 ENCOUNTER — Emergency Department (HOSPITAL_COMMUNITY): Payer: Self-pay

## 2013-12-30 ENCOUNTER — Observation Stay (HOSPITAL_COMMUNITY): Payer: Self-pay

## 2013-12-30 DIAGNOSIS — G8250 Quadriplegia, unspecified: Secondary | ICD-10-CM

## 2013-12-30 DIAGNOSIS — J329 Chronic sinusitis, unspecified: Secondary | ICD-10-CM | POA: Diagnosis present

## 2013-12-30 MED ORDER — BUDESONIDE-FORMOTEROL FUMARATE 160-4.5 MCG/ACT IN AERO
2.0000 | INHALATION_SPRAY | Freq: Two times a day (BID) | RESPIRATORY_TRACT | Status: DC
  Filled 2013-12-30 (×2): qty 6

## 2013-12-30 MED ORDER — TRETINOIN 0.1 % EX CREA: 1.0000 "application " | TOPICAL_CREAM | Freq: Every day | CUTANEOUS | Status: DC

## 2013-12-30 MED ORDER — CITALOPRAM HYDROBROMIDE 20 MG PO TABS
20.0000 mg | ORAL_TABLET | Freq: Every evening | ORAL | Status: DC
  Filled 2013-12-30: qty 1

## 2013-12-30 MED ORDER — GADOBENATE DIMEGLUMINE 529 MG/ML IV SOLN: 20.0000 mL | Freq: Once | INTRAVENOUS | Status: DC | PRN

## 2013-12-30 MED ORDER — ALBUTEROL SULFATE (2.5 MG/3ML) 0.083% IN NEBU: 2.5000 mg | INHALATION_SOLUTION | Freq: Four times a day (QID) | RESPIRATORY_TRACT | Status: DC | PRN

## 2013-12-30 MED ORDER — TOPIRAMATE 100 MG PO TABS
100.0000 mg | ORAL_TABLET | Freq: Every day | ORAL | Status: DC
  Filled 2013-12-30: qty 1

## 2013-12-30 MED ORDER — VITAMIN D3 25 MCG (1000 UNIT) PO TABS
1000.0000 [IU] | ORAL_TABLET | Freq: Every day | ORAL | Status: DC
  Administered 2013-12-30: 1000 [IU] via ORAL
  Filled 2013-12-30: qty 1

## 2013-12-30 MED ORDER — ALBUTEROL SULFATE HFA 108 (90 BASE) MCG/ACT IN AERS: 2.0000 | INHALATION_SPRAY | Freq: Four times a day (QID) | RESPIRATORY_TRACT | Status: DC | PRN

## 2013-12-30 MED ORDER — AZITHROMYCIN 250 MG PO TABS: ORAL_TABLET | ORAL | Status: DC

## 2013-12-30 MED ORDER — MONTELUKAST SODIUM 10 MG PO TABS
10.0000 mg | ORAL_TABLET | Freq: Every day | ORAL | Status: DC
  Filled 2013-12-30: qty 1

## 2013-12-30 MED ORDER — GADOBENATE DIMEGLUMINE 529 MG/ML IV SOLN
20.0000 mL | Freq: Once | INTRAVENOUS | Status: AC
  Administered 2013-12-30: 20 mL via INTRAVENOUS

## 2013-12-30 NOTE — Progress Notes (Signed)
Patient arrived to room via stretcher accompanied by family. Patient noted to be able to ambulate from stretcher to bed with minimal assistance. Patient report that this is much better than while she was in the ED. Safety precautions and orders reviewed with patient/family. Call light within reach. Bed alarm activated. Will continue to monitor,

## 2013-12-30 NOTE — Progress Notes (Signed)
Patient asking for assistance with her hospital bill.  I called [phone] and left a message for the financial counselor.  Lance BoschAnna Abriel Geesey, RN

## 2013-12-30 NOTE — ED Provider Notes (Signed)
Medical screening examination/treatment/procedure(s) were performed by non-physician practitioner and as supervising physician I was immediately available for consultation/collaboration.  EKG Interpretation    Date/Time:  Wednesday December 29 2013 19:42:46 EST Ventricular Rate:  83 PR Interval:  144 QRS Duration: 75 QT Interval:  363 QTC Calculation: 426 R Axis:   41 Text Interpretation:  Normal sinus rhythm Normal ECG No significant change since last tracing Confirmed by Taryll Reichenberger  MD, Mical Kicklighter ([id number]) on 12/30/2013 1:21:07 AM              Audree CamelScott T Jacorion Klem, MD 12/30/13 [phone]

## 2013-12-30 NOTE — Progress Notes (Signed)
Patient wheeled downstairs for discharge by TrivoliZoey, NT.  Lance BoschAnna Amedio Bowlby, RN

## 2013-12-30 NOTE — H&P (Signed)
[facility] History and Physical  Abuk Selleck ZOX:[id number] DOB: [birth year] DOA: 12/29/2013  Referring physician: EDP PCP: Dow Adolph, PA-C   Chief Complaint: Quadriparesis   HPI: Nancy Buchanan is a 33 y.o. female h/o pseudotumor cerebri, who presents to the ED after sudden onset of weakness of all 4 extremities.  She states she was light headed, and so she sat down.  After sitting down she realized she was unable to move all 4 extremities, she also reports numbness, but no pain.  Since onset she has had slow return of sensation and movement.  Now during my evaluation reports she thinks she is back to baseline.  Denies trauma.  Review of Systems: Systems reviewed.  As above, otherwise negative  Past Medical History  Diagnosis Date  . Asthma   . Hyperlipidemia   . Intracranial hypertension    Past Surgical History  Procedure Laterality Date  . Cholecystectomy  2012   Social History:  reports that she quit smoking about 9 months ago. Her smoking use included Cigars. She has never used smokeless tobacco. She reports that she drinks alcohol. She reports that she does not use illicit drugs.  Allergies  Allergen Reactions  . Other Anaphylaxis    peanuts    Family History  Problem Relation Age of Onset  . Diabetes Father   . Breast cancer Paternal Aunt   . Hyperlipidemia Other   . Sleep apnea Other      Prior to Admission medications   Medication Sig Start Date End Date Taking? Authorizing Provider  albuterol (PROAIR HFA) 108 (90 BASE) MCG/ACT inhaler Inhale 2 puffs into the lungs every 6 (six) hours as needed for shortness of breath.    Yes Historical Provider, MD  budesonide-formoterol (SYMBICORT) 160-4.5 MCG/ACT inhaler Take 2 puffs first thing in am and then another 2 puffs about 12 hours later. 03/12/13  Yes Nyoka Cowden, MD  cholecalciferol (VITAMIN D) 1000 UNITS tablet Take 1,000 Units by mouth daily.   Yes Historical Provider, MD  citalopram  (CELEXA) 20 MG tablet Take 20 mg by mouth every evening.   Yes Historical Provider, MD  montelukast (SINGULAIR) 10 MG tablet Take 10 mg by mouth at bedtime.   Yes Historical Provider, MD  topiramate (TOPAMAX) 100 MG tablet Take 100 mg by mouth at bedtime. 10/12/13  Yes Nilda Riggs, NP  tretinoin (RETIN-A) 0.1 % cream Apply 1 application topically at bedtime.   Yes Historical Provider, MD   Physical Exam: Filed Vitals:   12/30/13 0239  BP: 107/62  Pulse: 72  Temp:   Resp:     BP 107/62  Pulse 72  Temp(Src) 98.1 F (36.7 C) (Oral)  Resp 20  SpO2 97%  LMP 12/10/2013  General Appearance:    Alert, oriented, no distress, appears stated age  Head:    Normocephalic, atraumatic  Eyes:    PERRL, EOMI, sclera non-icteric        Nose:   Nares without drainage or epistaxis. Mucosa, turbinates normal  Throat:   Moist mucous membranes. Oropharynx without erythema or exudate.  Neck:   Supple. No carotid bruits.  No thyromegaly.  No lymphadenopathy.   Back:     No CVA tenderness, no spinal tenderness  Lungs:     Clear to auscultation bilaterally, without wheezes, rhonchi or rales  Chest wall:    No tenderness to palpitation  Heart:    Regular rate and rhythm without murmurs, gallops, rubs  Abdomen:     Soft,  non-tender, nondistended, normal bowel sounds, no organomegaly  Genitalia:    deferred  Rectal:    deferred  Extremities:   No clubbing, cyanosis or edema.  Pulses:   2+ and symmetric all extremities  Skin:   Skin color, texture, turgor normal, no rashes or lesions  Lymph nodes:   Cervical, supraclavicular, and axillary nodes normal  Neurologic:   Normal strength in BUE, RLE, but does have reduced strength in plantar flexion of LLE    Labs on Admission:  Basic Metabolic Panel:  Recent Labs Lab 12/29/13 2108  NA 142  K 4.0  CL 104  CO2 25  GLUCOSE 88  BUN 9  CREATININE 0.75  CALCIUM 9.2   Liver Function Tests: No results found for this basename: AST, ALT,  ALKPHOS, BILITOT, PROT, ALBUMIN,  in the last 168 hours No results found for this basename: LIPASE, AMYLASE,  in the last 168 hours No results found for this basename: AMMONIA,  in the last 168 hours CBC:  Recent Labs Lab 12/29/13 2108  WBC 11.2*  NEUTROABS 6.4  HGB 11.7*  HCT 37.1  MCV 86.3  PLT 329   Cardiac Enzymes: No results found for this basename: CKTOTAL, CKMB, CKMBINDEX, TROPONINI,  in the last 168 hours  BNP (last 3 results) No results found for this basename: PROBNP,  in the last 8760 hours CBG: No results found for this basename: GLUCAP,  in the last 168 hours  Radiological Exams on Admission: Ct Head Wo Contrast  12/29/2013   CLINICAL DATA:  Sudden onset of dizziness with weakness and numbness in all extremities  EXAM: CT HEAD WITHOUT CONTRAST  TECHNIQUE: Contiguous axial images were obtained from the base of the skull through the vertex without intravenous contrast.  COMPARISON:  None available  FINDINGS: There is no acute intracranial hemorrhage or infarct. No mass lesion or midline shift. Gray-white matter differentiation is well maintained. Ventricles are normal in size without evidence of hydrocephalus. CSF containing spaces are within normal limits. No extra-axial fluid collection.  The calvarium is intact.  Orbital soft tissues are within normal limits.  Air-fluid levels are present within the maxillary sinuses bilaterally as well as the left sphenoid sinus. There is scattered opacification of the anterior posterior ethmoidal air cells bilaterally.  No mastoid effusion.  Scalp soft tissues are unremarkable.  IMPRESSION: 1. Normal head CT with no acute intracranial process. 2. Acute sinusitis involving the bilateral maxillary sinuses, left sphenoid sinus, and ethmoidal air cells.   Electronically Signed   By: Rise MuBenjamin  McClintock M.D.   On: 12/29/2013 22:34   Ct Cervical Spine Wo Contrast  12/30/2013   CLINICAL DATA:  Bilateral extremity weakness and numbness  EXAM: CT  CERVICAL SPINE WITHOUT CONTRAST  TECHNIQUE: Multidetector CT imaging of the cervical spine was performed without intravenous contrast. Multiplanar CT image reconstructions were also generated.  COMPARISON:  None.  FINDINGS: There is no fracture or spondylolisthesis. Prevertebral soft tissues and predental space regions are normal. Disc spaces appear intact. There is no appreciable facet arthropathy. No nerve root edema or effacement. No disc extrusion or stenosis. There is no obvious mass or fluid collection.  The visualized upper lobes appear normal. Note that there is extensive paranasal sinus disease with air-fluid levels in both maxillary antra. Mastoid air cells are clear.  IMPRESSION: Extensive paranasal sinus disease.  No fracture or spondylolisthesis. No disc extrusion. No arthropathy appreciable.  There is no demonstrable fluid or lesion involving the visualized cord region. It should be noted  that MR is much more sensitive than noncontrast enhanced CT for lesions involving the cord or epidural regions.   Electronically Signed   By: Bretta Bang M.D.   On: 12/30/2013 01:35   Ct Thoracic Spine Wo Contrast  12/30/2013   CLINICAL DATA:  Numbness of extremities  EXAM: CT THORACIC SPINE WITHOUT CONTRAST  TECHNIQUE: Multidetector CT imaging of the thoracic spine was performed without intravenous contrast administration. Multiplanar CT image reconstructions were also generated.  COMPARISON:  None.  FINDINGS: There is no fracture or spondylolisthesis. Disc spaces appear intact. There is no nerve root edema or effacement. No disc extrusion or stenosis. There is no appreciable facet arthropathy. There are no paraspinous lesions. Visualized lungs appear clear.  IMPRESSION: No abnormality noted. It should be noted that if there is concern for lesion involving the thoracic cord or epidural spaces, MR is more sensitive than noncontrast enhanced CT to evaluate these areas.   Electronically Signed   By: Bretta Bang M.D.   On: 12/30/2013 01:39    EKG: Independently reviewed.  Assessment/Plan Principal Problem:   Quadriparesis   1. Quadriparesis - please see Dr. Alene Mires consult note.  DDX includes: Psychosomatic, spinal cord infarction, transverse myelitis, spinal cord injury, and spinal cord compression.  MRI head and c-spine are pending at this time.    Code Status: Full Code  Family Communication: Parents at bedside Disposition Plan: Admit to obs   Time spent: 50 min  Glendy Barsanti M. [facility] Pager [phone]  If 7AM-7PM, please contact the day team taking care of the patient [email or web address] Password [email or web address] 12/30/2013, 4:15 AM

## 2013-12-30 NOTE — Progress Notes (Signed)
Patient mother found this Rn in the hall and asked if the MD was going to treat the patient's sinusitis.  This RN text paged Dr. Blake DivineAkula.  Lance BoschAnna Arlet Marter, RN

## 2013-12-30 NOTE — ED Notes (Signed)
Pt to ct scan via strecher

## 2013-12-30 NOTE — Consult Note (Signed)
Neurology Consultation Reason for Consult: Weakness Referring Physician: Alric RanGoldston, S  CC: Quadriparesis  History is obtained from: Patient, the family  HPI: Nancy Buchanan is a 33 y.o. female with a history of intracranial hypertension diagnosed with routine funduscopic exam. She had an OP of 36 when checked and started on acetazolamide. Then switched to topamax and tolerating it well. No headaches.   She presents today after sudden onset weakness of all 4 surety knees. She states that she was lightheaded, one of her to sit down and after she was sitting realized that she was unable to move her arms or legs. She also reported numbness. Since onset, she has slowly had return of sensation in movement, predominantly of her upper extremities, but now of her left lower extremity as well.  She is still significantly weak, however.   ROS: A 14 point ROS was performed and is negative except as noted in the HPI.  Past Medical History  Diagnosis Date  . Asthma   . Hyperlipidemia   . Intracranial hypertension     Family History: Uncle - neurysm  Social History: Tob: none  Exam: Current vital signs: BP 108/68  Pulse 75  Temp(Src) 98.1 F (36.7 C) (Oral)  Resp 20  SpO2 96%  LMP 12/10/2013 Vital signs in last 24 hours: Temp:  [98.1 F (36.7 C)-98.5 F (36.9 C)] 98.1 F (36.7 C) (01/07 2221) Pulse Rate:  [70-85] 75 (01/07 2356) Resp:  [15-23] 20 (01/07 2356) BP: (108-120)/(68-77) 108/68 mmHg (01/07 2356) SpO2:  [96 %-100 %] 96 % (01/07 2356)  General: in bed, nad CV: rrr Mental Status: Patient is awake, alert, oriented to person, place, month, year, and situation. Immediate and remote memory are intact. Patient is able to give a clear and coherent history. No signs of aphasia or neglect Cranial Nerves: II: Visual Fields are full. Pupils are equal, round, and reactive to light.  Discs are difficult to visualize. III,IV, VI: EOMI without ptosis or diploplia.  V: Facial  sensation is symmetric to temperature VII: Facial movement is symmetric.  VIII: hearing is intact to voice X: Uvula elevates symmetrically XI: Shoulder shrug is symmetric. XII: tongue is midline without atrophy or fasciculations.  Motor: Tone is normal. Bulk is normal. 5/5 strength was present in left arm. In right arm, she had 3-4/5 strength, in left leg she is able to keep her leg extended, but weak flexion and hip flexion. Her right leg has initially minimal movement, but is able to keepr her right leg extended when lifted.  Sensory: Sensation is diminished to pin in the right arm. No clear spinal level to pin. Decreased to vibration bilateral toes.  Deep Tendon Reflexes: 2+ and symmetric in the biceps and patellae.  Plantars: Toes are downgoing bilaterally.  Cerebellar: FNF intact on right, unable to perform with other extremities.  Gait: Unable to walk.      I have reviewed labs in epic and the results pertinent to this consultation are: Mild leukocytosis.   I have reviewed the images obtained:CT head - negative.   Impression: 33 yo F with sudden onset transient lightheadedness and now improving quadriparesis. Possbilities include spinal cord infarction (though this is typically very painful and rarely improves this rapidly), transverse myelitis(though it would be very unusual to present this acutely), spinal cord injury(though no history of trauma).    Recommendations: 1) MRI brain and C-spine.  2) Further recommendations following imaging.    Ritta SlotMcNeill Basilio Meadow, MD [facility] [phone]  If 7pm- 7am, please  page neurology on call at [phone].

## 2013-12-30 NOTE — Progress Notes (Signed)
Patient education and paperwork complete.  IV removed and prescription given to the patient for her Z-pack.  Lance BoschAnna Indiah Heyden, RN

## 2013-12-30 NOTE — Discharge Summary (Signed)
Physician Discharge Summary  Nancy Buchanan ZOX:[id number] DOB: Nov 10, 1981 DOA: 12/29/2013  PCP: Dow AdolphSTEVENS, LISA LAJUANA, Buchanan  Admit date: 12/29/2013 Discharge date: 12/30/2013  Time spent: 45 minutes  Recommendations for Outpatient Follow-up:  Follow up with PCP re: sinusitis.  Discharge Diagnoses:  Principal Problem:   Quadriparesis Active Problems:   Sinus infection   Discharge Condition: stable, full function has returned   Diet recommendation: regular diet  Filed Weights   12/30/13 0519  Weight: 98.884 kg (218 lb)    History of present illness:  Nancy MangesKimberly Buchanan is a 33 y.o. female h/o intracranial hypertension (Optic pressure of 36 on funduscopic exam) who presented to the ED after sudden onset of weakness of all 4 extremities. She states she was light headed, and so she sat down. After sitting down she realized she was unable to move all 4 extremities, she also reports numbness, but no pain. Since onset she has had slow return of sensation and movement. Now during my evaluation reports she thinks she is back to baseline. Denies trauma.  Hospital Course:   Acute transient quadriparesis Seen by Neurology.  Concerned for spinal cord infarction, transverse myelitis, or spinal cord injury. CT head, cervical spine and thoracic spine, as well as MRI head, and MR brain and cervical spine were conducted.  Imaging provided no explanation for the acute quadriparesis and did rule out the neurologists concerns. On my exam of the patient she appears well, is completely devoid of symptoms and has been cleared by the neurologist for discharge.  Sinusitis Noted on CT head Patient prescribed Z-Pac  (azithromycin) on discharge.   Procedures: None  Consultations: Neurology  Discharge Exam: Filed Vitals:   12/30/13 0948  BP: 104/67  Pulse: 82  Temp: 98 F (36.7 C)  Resp: 18    General: A&O, NAD, Appears well, family at bedside Cardiovascular: rrr, no m/r/g Respiratory:  cta, no w/c/r Abdomen:  Soft, nt, nd, +bs, no masses Neuro:  CN 2-12 grossly in tact, no focal deficits Extremities: 5/5 strength in each extremity.  Discharge Instructions      Discharge Orders   Future Appointments Provider Department Dept Phone   03/02/2014 10:00 AM Nilda RiggsNancy Carolyn Martin, NP [facility] [phone]   Future Orders Complete By Expires   Diet - low sodium heart healthy  As directed    Increase activity slowly  As directed        Medication List         azithromycin 250 MG tablet  Commonly known as:  ZITHROMAX Z-PAK  Use as directed.     budesonide-formoterol 160-4.5 MCG/ACT inhaler  Commonly known as:  SYMBICORT  Take 2 puffs first thing in am and then another 2 puffs about 12 hours later.     cholecalciferol 1000 UNITS tablet  Commonly known as:  VITAMIN D  Take 1,000 Units by mouth daily.     citalopram 20 MG tablet  Commonly known as:  CELEXA  Take 20 mg by mouth every evening.     montelukast 10 MG tablet  Commonly known as:  SINGULAIR  Take 10 mg by mouth at bedtime.     PROAIR HFA 108 (90 BASE) MCG/ACT inhaler  Generic drug:  albuterol  Inhale 2 puffs into the lungs every 6 (six) hours as needed for shortness of breath.     topiramate 100 MG tablet  Commonly known as:  TOPAMAX  Take 100 mg by mouth at bedtime.     tretinoin 0.1 % cream  Commonly known as:  RETIN-A  Apply 1 application topically at bedtime.       Allergies  Allergen Reactions  . Other Anaphylaxis    peanuts   Follow-up Information   Follow up with Buchanan, Nancy Keto, Buchanan. Schedule an appointment as soon as possible for a visit in 2 weeks.   Specialty:  Physician Assistant   Contact information:   [address] [place] [address] [phone]        The results of significant diagnostics from this hospitalization (including imaging, microbiology, ancillary and laboratory) are listed below for reference.    Significant  Diagnostic Studies: Ct Head Wo Contrast  12/29/2013   CLINICAL DATA:  Sudden onset of dizziness with weakness and numbness in all extremities  EXAM: CT HEAD WITHOUT CONTRAST  TECHNIQUE: Contiguous axial images were obtained from the base of the skull through the vertex without intravenous contrast.  COMPARISON:  None available  FINDINGS: There is no acute intracranial hemorrhage or infarct. No mass lesion or midline shift. Gray-white matter differentiation is well maintained. Ventricles are normal in size without evidence of hydrocephalus. CSF containing spaces are within normal limits. No extra-axial fluid collection.  The calvarium is intact.  Orbital soft tissues are within normal limits.  Air-fluid levels are present within the maxillary sinuses bilaterally as well as the left sphenoid sinus. There is scattered opacification of the anterior posterior ethmoidal air cells bilaterally.  No mastoid effusion.  Scalp soft tissues are unremarkable.  IMPRESSION: 1. Normal head CT with no acute intracranial process. 2. Acute sinusitis involving the bilateral maxillary sinuses, left sphenoid sinus, and ethmoidal air cells.   Electronically Signed   By: Rise Mu M.D.   On: 12/29/2013 22:34   Ct Cervical Spine Wo Contrast  12/30/2013   CLINICAL DATA:  Bilateral extremity weakness and numbness  EXAM: CT CERVICAL SPINE WITHOUT CONTRAST  TECHNIQUE: Multidetector CT imaging of the cervical spine was performed without intravenous contrast. Multiplanar CT image reconstructions were also generated.  COMPARISON:  None.  FINDINGS: There is no fracture or spondylolisthesis. Prevertebral soft tissues and predental space regions are normal. Disc spaces appear intact. There is no appreciable facet arthropathy. No nerve root edema or effacement. No disc extrusion or stenosis. There is no obvious mass or fluid collection.  The visualized upper lobes appear normal. Note that there is extensive paranasal sinus disease with  air-fluid levels in both maxillary antra. Mastoid air cells are clear.  IMPRESSION: Extensive paranasal sinus disease.  No fracture or spondylolisthesis. No disc extrusion. No arthropathy appreciable.  There is no demonstrable fluid or lesion involving the visualized cord region. It should be noted that MR is much more sensitive than noncontrast enhanced CT for lesions involving the cord or epidural regions.   Electronically Signed   By: Bretta Bang M.D.   On: 12/30/2013 01:35   Ct Thoracic Spine Wo Contrast  12/30/2013   CLINICAL DATA:  Numbness of extremities  EXAM: CT THORACIC SPINE WITHOUT CONTRAST  TECHNIQUE: Multidetector CT imaging of the thoracic spine was performed without intravenous contrast administration. Multiplanar CT image reconstructions were also generated.  COMPARISON:  None.  FINDINGS: There is no fracture or spondylolisthesis. Disc spaces appear intact. There is no nerve root edema or effacement. No disc extrusion or stenosis. There is no appreciable facet arthropathy. There are no paraspinous lesions. Visualized lungs appear clear.  IMPRESSION: No abnormality noted. It should be noted that if there is concern for lesion involving the thoracic cord  or epidural spaces, MR is more sensitive than noncontrast enhanced CT to evaluate these areas.   Electronically Signed   By: Bretta Bang M.D.   On: 12/30/2013 01:39   Mr Laqueta Jean ZO Contrast  12/30/2013   CLINICAL DATA:  History pseudotumor cerebri. Presenting with acute onset of weakness in all 4 extremities which is resolving. Lightheadedness. Hyperlipidemia.  EXAM: MRI HEAD WITHOUT AND WITH CONTRAST  MRI CERVICAL SPINE WITHOUT AND WITH CONTRAST  TECHNIQUE: Multiplanar, multiecho pulse sequences of the brain and surrounding structures, and cervical spine, to include the craniocervical junction and cervicothoracic junction, were obtained without and with intravenous contrast.  CONTRAST:  20 cc MultiHance.  COMPARISON:  12/30/2013  cervical spine CT. 12/29/2013 head CT. No comparison MR.  FINDINGS: MRI HEAD FINDINGS  No acute infarct.  No intracranial hemorrhage.  No intracranial mass or abnormal enhancement.  Inferior to the right caudate head and medial to the right basal ganglia is a curvilinear region of altered signal intensity. This may reflect a prominent peri vascular space as there are tiny peri vascular spaces in the basal ganglia bilaterally. On FLAIR imaging there is a rim of slight increased signal and therefore remote small infarct as cause of this appearance not excluded.  No focal area of altered signal intensity to suggest the presence of a demyelinating process.  Cervical medullary junction and pineal region unremarkable.  Sella slightly narrowed in a right to left direction without sella mass identified. No expanded partially empty sella detected as can be seen in patients with pseudotumor cerebri. No dilated peri optic spaces. Minimal exophthalmos.  Paranasal sinus mucosal thickening/ partial opacification with air-fluid levels maxillary sinuses suggesting the presence of acute sinusitis. No intraorbital or intracranial extension noted.  Small parotid lesions may be lymph nodes.  MRI CERVICAL SPINE FINDINGS  Decreased signal intensity of bone marrow may be related to patient's habitus or anemia.  Cervical medullary junction unremarkable. Visualized paravertebral structures unremarkable.  No focal cord signal abnormality or abnormal enhancement. Images are slightly motion degraded.  No evidence of cervical disc herniation.  Minimal bulge C5-6 level.  IMPRESSION: Brain MR:  No acute infarct.  No intracranial mass or abnormal enhancement.  Inferior to the right caudate head and medial to the right basal ganglia is a curvilinear region of altered signal intensity. This may reflect a prominent peri vascular space as there are tiny peri vascular spaces in the basal ganglia bilaterally. On FLAIR imaging there is a rim of slight  increased signal and therefore remote small infarct as cause of this appearance not excluded.  Paranasal sinus mucosal thickening/ partial opacification with air-fluid levels maxillary sinuses suggesting the presence of acute sinusitis.  Small parotid lesions may be lymph nodes.  Cervical spine MR:  No focal cervical cord signal abnormality or abnormal enhancement.  No disc herniation.  Minimal bulge C5-6.  Decreased signal intensity of bone marrow may be related to patient's habitus or anemia.   Electronically Signed   By: Bridgett Larsson M.D.   On: 12/30/2013 08:30   Mr Cervical Spine W Wo Contrast  12/30/2013   CLINICAL DATA:  History pseudotumor cerebri. Presenting with acute onset of weakness in all 4 extremities which is resolving. Lightheadedness. Hyperlipidemia.  EXAM: MRI HEAD WITHOUT AND WITH CONTRAST  MRI CERVICAL SPINE WITHOUT AND WITH CONTRAST  TECHNIQUE: Multiplanar, multiecho pulse sequences of the brain and surrounding structures, and cervical spine, to include the craniocervical junction and cervicothoracic junction, were obtained without and with intravenous contrast.  CONTRAST:  20 cc MultiHance.  COMPARISON:  12/30/2013 cervical spine CT. 12/29/2013 head CT. No comparison MR.  FINDINGS: MRI HEAD FINDINGS  No acute infarct.  No intracranial hemorrhage.  No intracranial mass or abnormal enhancement.  Inferior to the right caudate head and medial to the right basal ganglia is a curvilinear region of altered signal intensity. This may reflect a prominent peri vascular space as there are tiny peri vascular spaces in the basal ganglia bilaterally. On FLAIR imaging there is a rim of slight increased signal and therefore remote small infarct as cause of this appearance not excluded.  No focal area of altered signal intensity to suggest the presence of a demyelinating process.  Cervical medullary junction and pineal region unremarkable.  Sella slightly narrowed in a right to left direction without sella  mass identified. No expanded partially empty sella detected as can be seen in patients with pseudotumor cerebri. No dilated peri optic spaces. Minimal exophthalmos.  Paranasal sinus mucosal thickening/ partial opacification with air-fluid levels maxillary sinuses suggesting the presence of acute sinusitis. No intraorbital or intracranial extension noted.  Small parotid lesions may be lymph nodes.  MRI CERVICAL SPINE FINDINGS  Decreased signal intensity of bone marrow may be related to patient's habitus or anemia.  Cervical medullary junction unremarkable. Visualized paravertebral structures unremarkable.  No focal cord signal abnormality or abnormal enhancement. Images are slightly motion degraded.  No evidence of cervical disc herniation.  Minimal bulge C5-6 level.  IMPRESSION: Brain MR:  No acute infarct.  No intracranial mass or abnormal enhancement.  Inferior to the right caudate head and medial to the right basal ganglia is a curvilinear region of altered signal intensity. This may reflect a prominent peri vascular space as there are tiny peri vascular spaces in the basal ganglia bilaterally. On FLAIR imaging there is a rim of slight increased signal and therefore remote small infarct as cause of this appearance not excluded.  Paranasal sinus mucosal thickening/ partial opacification with air-fluid levels maxillary sinuses suggesting the presence of acute sinusitis.  Small parotid lesions may be lymph nodes.  Cervical spine MR:  No focal cervical cord signal abnormality or abnormal enhancement.  No disc herniation.  Minimal bulge C5-6.  Decreased signal intensity of bone marrow may be related to patient's habitus or anemia.   Electronically Signed   By: Bridgett Larsson M.D.   On: 12/30/2013 08:30    Labs: Basic Metabolic Panel:  Recent Labs Lab 12/29/13 2108  NA 142  K 4.0  CL 104  CO2 25  GLUCOSE 88  BUN 9  CREATININE 0.75  CALCIUM 9.2   CBC:  Recent Labs Lab 12/29/13 2108  WBC 11.2*   NEUTROABS 6.4  HGB 11.7*  HCT 37.1  MCV 86.3  PLT 329      SignedConley Canal [phone]  [facility] 12/30/2013, 3:53 PM

## 2014-01-20 ENCOUNTER — Emergency Department (HOSPITAL_COMMUNITY)
Admission: EM | Admit: 2014-01-20 | Discharge: 2014-01-20 | Disposition: A | Discharge status: Home / Self Care | Payer: BC Managed Care – PPO | Attending: Emergency Medicine | Admitting: Emergency Medicine

## 2014-01-20 ENCOUNTER — Encounter (HOSPITAL_COMMUNITY): Payer: Self-pay | Admitting: Emergency Medicine

## 2014-01-20 DIAGNOSIS — Z8639 Personal history of other endocrine, nutritional and metabolic disease: Secondary | ICD-10-CM | POA: Insufficient documentation

## 2014-01-20 DIAGNOSIS — W108XXA Fall (on) (from) other stairs and steps, initial encounter: Secondary | ICD-10-CM | POA: Insufficient documentation

## 2014-01-20 DIAGNOSIS — Z87891 Personal history of nicotine dependence: Secondary | ICD-10-CM | POA: Insufficient documentation

## 2014-01-20 DIAGNOSIS — I10 Essential (primary) hypertension: Secondary | ICD-10-CM | POA: Insufficient documentation

## 2014-01-20 DIAGNOSIS — Z79899 Other long term (current) drug therapy: Secondary | ICD-10-CM | POA: Insufficient documentation

## 2014-01-20 DIAGNOSIS — Y939 Activity, unspecified: Secondary | ICD-10-CM | POA: Insufficient documentation

## 2014-01-20 DIAGNOSIS — R42 Dizziness and giddiness: Secondary | ICD-10-CM | POA: Insufficient documentation

## 2014-01-20 DIAGNOSIS — Z8673 Personal history of transient ischemic attack (TIA), and cerebral infarction without residual deficits: Secondary | ICD-10-CM | POA: Insufficient documentation

## 2014-01-20 DIAGNOSIS — J45909 Unspecified asthma, uncomplicated: Secondary | ICD-10-CM | POA: Insufficient documentation

## 2014-01-20 DIAGNOSIS — S0990XA Unspecified injury of head, initial encounter: Secondary | ICD-10-CM | POA: Insufficient documentation

## 2014-01-20 DIAGNOSIS — R51 Headache: Secondary | ICD-10-CM

## 2014-01-20 DIAGNOSIS — R519 Headache, unspecified: Secondary | ICD-10-CM

## 2014-01-20 DIAGNOSIS — Y92009 Unspecified place in unspecified non-institutional (private) residence as the place of occurrence of the external cause: Secondary | ICD-10-CM | POA: Insufficient documentation

## 2014-01-20 DIAGNOSIS — Z862 Personal history of diseases of the blood and blood-forming organs and certain disorders involving the immune mechanism: Secondary | ICD-10-CM | POA: Insufficient documentation

## 2014-01-20 HISTORY — DX: Transient cerebral ischemic attack, unspecified: G45.9

## 2014-01-20 MED ORDER — ACETAMINOPHEN 500 MG PO TABS
1000.0000 mg | ORAL_TABLET | Freq: Once | ORAL | Status: AC
  Administered 2014-01-20: 1000 mg via ORAL
  Filled 2014-01-20: qty 2

## 2014-01-20 MED ORDER — PROCHLORPERAZINE EDISYLATE 5 MG/ML IJ SOLN
10.0000 mg | Freq: Once | INTRAMUSCULAR | Status: AC
  Administered 2014-01-20: 10 mg via INTRAVENOUS
  Filled 2014-01-20: qty 2

## 2014-01-20 MED ORDER — DIPHENHYDRAMINE HCL 50 MG/ML IJ SOLN
12.5000 mg | Freq: Once | INTRAMUSCULAR | Status: AC
  Administered 2014-01-20: 12.5 mg via INTRAVENOUS
  Filled 2014-01-20: qty 1

## 2014-01-20 NOTE — Discharge Instructions (Signed)
Make a follow up appointment with your Neurologist in 2 days. Return to ED should your headache return or you develop new/worsening symptoms.

## 2014-01-20 NOTE — ED Provider Notes (Signed)
CSN: [id number]     Arrival date & time 01/20/14  1704 History   First MD Initiated Contact with Patient 01/20/14 1728     Chief Complaint  Patient presents with  . Headache   (Consider location/radiation/quality/duration/timing/severity/associated sxs/prior Treatment) HPI 33 yo AA female presents gradual onset of HA that is dull, achy, and pressurelike. Pain currently a 4/10 without radition. Denies anything making pain better or worse. Patient admits to lightheadedness. Denies CP, SOB, or palpitations. Seen in ED on 12/30/13 and admitted for suspected TIA due to extremity paralysis with similar sxs as today. [facility] PMH significant for intracranial HTN x 1 yr ago. Patient seen at Urgent Care and treated with IM toradol 60 mg. Which helped HA. Denies fever/chills. Admits to "hotflashes". Nausea intermittent. No vomiting, diarrhea or constipation. Blurred vision when HA comes on. Not having currently.  Past Medical History  Diagnosis Date  . Asthma   . Hyperlipidemia   . Intracranial hypertension   . TIA (transient ischemic attack)    Past Surgical History  Procedure Laterality Date  . Cholecystectomy  2012   Family History  Problem Relation Age of Onset  . Diabetes Father   . Breast cancer Paternal Aunt   . Hyperlipidemia Other   . Sleep apnea Other    History  Substance Use Topics  . Smoking status: Former Smoker    Types: Cigars    Quit date: 03/13/2013  . Smokeless tobacco: Never Used     Comment: pt. stopped smoking cigars as of last visit  . Alcohol Use: Yes     Comment: social    OB History   Grav Para Term Preterm Abortions TAB SAB Ect Mult Living                 Review of Systems  All other systems reviewed and are negative.    Allergies  Other  Home Medications   Current Outpatient Rx  Name  Route  Sig  Dispense  Refill  . albuterol (PROAIR HFA) 108 (90 BASE) MCG/ACT inhaler   Inhalation   Inhale 2  puffs into the lungs every 6 (six) hours as needed for shortness of breath.          . budesonide-formoterol (SYMBICORT) 160-4.5 MCG/ACT inhaler      Take 2 puffs first thing in am and then another 2 puffs about 12 hours later.   1 Inhaler   12   . cholecalciferol (VITAMIN D) 1000 UNITS tablet   Oral   Take 1,000 Units by mouth daily.         . citalopram (CELEXA) 20 MG tablet   Oral   Take 20 mg by mouth every evening.         . montelukast (SINGULAIR) 10 MG tablet   Oral   Take 10 mg by mouth at bedtime.         . topiramate (TOPAMAX) 100 MG tablet   Oral   Take 100 mg by mouth at bedtime.         . tretinoin (RETIN-A) 0.1 % cream   Topical   Apply 1 application topically at bedtime.         Marland Kitchen. azithromycin (ZITHROMAX Z-PAK) 250 MG tablet      Use as directed.   6 each   0    BP 106/83  Pulse 83  Temp(Src) 98.2 F (36.8 C) (Oral)  Resp 19  Ht 5\' 6"  (  1.676 m)  Wt 224 lb (101.606 kg)  BMI 36.17 kg/m2  SpO2 99%  LMP 01/10/2014 Physical Exam  Nursing note and vitals reviewed. Constitutional: She is oriented to person, place, and time. She appears well-developed and well-nourished. No distress.  HENT:  Head: Normocephalic and atraumatic.  Eyes: Conjunctivae and EOM are normal. Pupils are equal, round, and reactive to light.  Neck: Normal range of motion. Neck supple. No JVD present.  Cardiovascular: Normal rate and regular rhythm.  Exam reveals no gallop and no friction rub.   No murmur heard. Pulmonary/Chest: Effort normal and breath sounds normal. No respiratory distress. She has no wheezes. She has no rales.  Musculoskeletal: Normal range of motion. She exhibits no edema.  Neurological: She is alert and oriented to person, place, and time. She has normal strength. No cranial nerve deficit or sensory deficit. Coordination and gait normal.  CN II-XII grossly intact. Cerebellar function grossly intact with finger to nose. Patient ambulates well in room  without assistance. Able to walk forward and backward without difficulty.   Skin: Skin is warm and dry. She is not diaphoretic.  Psychiatric: She has a normal mood and affect. Her behavior is normal.    ED Course  Procedures (including critical care time) Labs Review Labs Reviewed - No data to display Imaging Review No results found.  EKG Interpretation   None       MDM   1. HA (headache)   2. Fall down stairs    Patient afebrile with normal VS. Denies LOC and head trauma. No focal neuro deficits.  Patient HA resolved with tx in ED.  Plan to have patient follow up with neurologist in 2 days.    Meds given in ED:  Medications  acetaminophen (TYLENOL) tablet 1,000 mg (1,000 mg Oral Given 01/20/14 1841)  prochlorperazine (COMPAZINE) injection 10 mg (10 mg Intravenous Given 01/20/14 1844)  diphenhydrAMINE (BENADRYL) injection 12.5 mg (12.5 mg Intravenous Given 01/20/14 1842)    Discharge Medication List as of 01/20/2014  6:52 PM         Rudene Anda, PA-C 01/21/14 2035

## 2014-01-20 NOTE — ED Notes (Signed)
[facility] where she went c/o headache and dizziness that started at 1200 today. Last night pt fell down 5 steps at her apartment, but no injury or LOC. Pt was recently admitted for TIA. No neuro deficits at current. Speech is clear and concise, no weakness or droop. Was also recently treated for increased ICP. BP 124/70, HR 77, 96% RA, CBG 80. Was given 60 mg IM toradol at doctors office.

## 2014-01-20 NOTE — ED Notes (Signed)
MD at bedside. 

## 2014-01-21 NOTE — ED Provider Notes (Signed)
Medical screening examination/treatment/procedure(s) were performed by non-physician practitioner and as supervising physician I was immediately available for consultation/collaboration.  EKG Interpretation   None         Beni Turrell L Damarrion Mimbs, MD 01/21/14 2255 

## 2014-01-27 ENCOUNTER — Telehealth: Payer: Self-pay | Admitting: *Deleted

## 2014-01-28 NOTE — Telephone Encounter (Signed)
I called and LMVM for pt to return call.   

## 2014-01-31 NOTE — Telephone Encounter (Signed)
Pt returned call.   She stated that she had been to to ER twice in January.  (once for episode of quadriparesis with dizziness) and another headache.  I made appt for her as new problem existing pt.  I offered appt for this Thursday, and she could not come, next week better for her.   I made Consult appt with Dr. Anne HahnWillis on  Monday 02-07-14 at 0930 (be here 0915).   She verbalized understanding.

## 2014-02-07 ENCOUNTER — Encounter: Payer: Self-pay | Admitting: Neurology

## 2014-02-07 ENCOUNTER — Ambulatory Visit (INDEPENDENT_AMBULATORY_CARE_PROVIDER_SITE_OTHER): Payer: No Typology Code available for payment source | Admitting: Neurology

## 2014-02-07 VITALS — BP 140/70 | HR 88 | Wt 234.0 lb

## 2014-02-07 DIAGNOSIS — G932 Benign intracranial hypertension: Secondary | ICD-10-CM | POA: Insufficient documentation

## 2014-02-07 HISTORY — DX: Benign intracranial hypertension: G93.2

## 2014-02-07 MED ORDER — TOPIRAMATE 100 MG PO TABS: ORAL_TABLET | ORAL | Status: AC

## 2014-02-07 MED ORDER — SULFAMETHOXAZOLE-TRIMETHOPRIM 400-80 MG PO TABS: 1.0000 | ORAL_TABLET | Freq: Two times a day (BID) | ORAL | Status: DC

## 2014-02-07 NOTE — Progress Notes (Signed)
Reason for visit: Dizziness  Nancy Buchanan is an 33 y.o. female  History of present illness:  Nancy Buchanan is a 33 year old right-handed black female with a history of pseudotumor cerebri. The patient has been on Topamax taking 100 mg night, tolerating medication well. The patient was seen in the emergency room and admitted on 12/29/2013. The patient was at work, and then had onset of paralysis of the arms and legs for about 6 hours. The patient had no confusion, she was able to speak during the episode. The patient did have some dizziness and pressure sensation the head. The patient underwent a MRI study of the brain and cervical spine that was unremarkable. The patient has not had any problems controlling the bowels or the bladder. The patient denies numbness of the extremities. The patient had evidence of a sinus infection, and was treated with a Z-Pak. The patient still has some pressure and dizziness. The headache and dizziness sensations are coming on every 2 or 3 days. The patient returns to this office for an evaluation. The patient went back to the emergency room on 01/20/2014 with pressure sensation and dizziness. No paralysis was noted at that time.   Past Medical History  Diagnosis Date  . Asthma   . Hyperlipidemia   . Intracranial hypertension   . TIA (transient ischemic attack)   . Pseudotumor cerebri 02/07/2014    Past Surgical History  Procedure Laterality Date  . Cholecystectomy  2012    Family History  Problem Relation Age of Onset  . Diabetes Father   . Breast cancer Paternal Aunt   . Hyperlipidemia Other   . Sleep apnea Other     Social history:  reports that she quit smoking about 10 months ago. Her smoking use included Cigars. She has never used smokeless tobacco. She reports that she drinks alcohol. She reports that she does not use illicit drugs.    Allergies  Allergen Reactions  . Other Anaphylaxis    peanuts peanuts    Medications:  Current  Outpatient Prescriptions on File Prior to Visit  Medication Sig Dispense Refill  . albuterol (PROAIR HFA) 108 (90 BASE) MCG/ACT inhaler Inhale 2 puffs into the lungs every 6 (six) hours as needed for shortness of breath.       . budesonide-formoterol (SYMBICORT) 160-4.5 MCG/ACT inhaler Take 2 puffs first thing in am and then another 2 puffs about 12 hours later.  1 Inhaler  12  . cholecalciferol (VITAMIN D) 1000 UNITS tablet Take 1,000 Units by mouth daily.      . citalopram (CELEXA) 20 MG tablet Take 20 mg by mouth every evening.      . montelukast (SINGULAIR) 10 MG tablet Take 10 mg by mouth at bedtime.      . tretinoin (RETIN-A) 0.1 % cream Apply 1 application topically at bedtime.       No current facility-administered medications on file prior to visit.    ROS:  Out of a complete 14 system review of symptoms, the patient complains only of the following symptoms, and all other reviewed systems are negative.  Nausea  Dizziness Headache  Blood pressure 140/70, pulse 88, weight 234 lb (106.142 kg), last menstrual period 01/10/2014.  Physical Exam  General: The patient is alert and cooperative at the time of the examination. the patient is moderately obese.   Skin: No significant peripheral edema is noted.   Neurologic Exam  Mental status: The patient is oriented x 3.  Cranial nerves: Facial  symmetry is present. Speech is normal, no aphasia or dysarthria is noted. Extraocular movements are full. Visual fields are full. pupils are equal, round, and react to light. Discs are slightly blurred in the nasal margins   Motor: The patient has good strength in all 4 extremities.  Sensory examination: Soft touch sensation is symmetric on the face, arms, and legs.  Coordination: The patient has good finger-nose-finger and heel-to-shin bilaterally.  Gait and station: The patient has a normal gait. Tandem gait is normal. Romberg is negative. No drift is seen.  Reflexes: Deep tendon  reflexes are symmetric.   MRI brain and cervical spine 12/30/2013:  IMPRESSION:  Brain MR:  No acute infarct.  No intracranial mass or abnormal enhancement.  Inferior to the right caudate head and medial to the right basal  ganglia is a curvilinear region of altered signal intensity. This  may reflect a prominent peri vascular space as there are tiny peri  vascular spaces in the basal ganglia bilaterally. On FLAIR imaging  there is a rim of slight increased signal and therefore remote small  infarct as cause of this appearance not excluded.  Paranasal sinus mucosal thickening/ partial opacification with  air-fluid levels maxillary sinuses suggesting the presence of acute  sinusitis.  Small parotid lesions may be lymph nodes.  Cervical spine MR:  No focal cervical cord signal abnormality or abnormal enhancement.  No disc herniation. Minimal bulge C5-6.  Decreased signal intensity of bone marrow may be related to  patient's habitus or anemia.    Assessment/Plan:  1. Pseudotumor cerebri  2. Recent sinus infection  3. Episodes of dizziness, pressure sensation, and paralysis  The patient has a normal clinical examination today. The patient will be increased on the Topamax taking 50 mg the morning and 100 mg in the evening. The patient will followup in about 4 months. If the pressure sensations and dizziness do not improve, the patient will be sent for a lumbar puncture to look at the opening pressure again. The patient was placed on Bactrim for her ongoing pressure sensations in the head to treat the sinus disease.   Marlan Buchanan. Keith Willis MD 02/07/2014 8:33 PM  [facility] [address] [place], [place] [id number]  Phone [phone] Fax [phone]

## 2014-03-02 ENCOUNTER — Ambulatory Visit (INDEPENDENT_AMBULATORY_CARE_PROVIDER_SITE_OTHER): Payer: No Typology Code available for payment source | Admitting: Nurse Practitioner

## 2014-03-02 ENCOUNTER — Encounter: Payer: Self-pay | Admitting: Nurse Practitioner

## 2014-03-02 VITALS — BP 95/68 | HR 98 | Ht 66.5 in | Wt 226.0 lb

## 2014-03-02 DIAGNOSIS — G932 Benign intracranial hypertension: Secondary | ICD-10-CM

## 2014-03-02 DIAGNOSIS — J329 Chronic sinusitis, unspecified: Secondary | ICD-10-CM

## 2014-03-02 DIAGNOSIS — H4710 Unspecified papilledema: Secondary | ICD-10-CM

## 2014-03-02 NOTE — Progress Notes (Signed)
[facility]  PATIENT: Nancy Buchanan DOB: 06-Feb-1981   REASON FOR VISIT: Followup for pseudotumor and dizziness   HISTORY OF PRESENT ILLNESS: Nancy Buchanan, 33 year old female returns for followup,  she was last seen in this office by Dr. Anne HahnWillis 02/07/14. She has history of pseudotumor. She had an episode in January where she had paralysis of the arms and legs for about 6 hours, was able to speak no confusion and she had dizziness and pressure. MRI of the brain was unremarkable except for presence of acute sinus infection. Marland Kitchen. MRI of the neck was unremarkable. She was treated with a Z-Pak continued to have dizziness and pressure. She was then given Bactrim. Saw her primary care last week and she was placed on Omnicef and 14 day prednisone Dosepak. She continues to have episodes of vertigo with the room spinning lasting about 5 minutes. She found no difference in the increasing her Topamax at last visit.  She returns for reevaluation.   The patient has been on Topamax taking 100 mg night, tolerating medication well. The patient was seen in the emergency room and admitted on 12/29/2013. The patient was at work, and then had onset of paralysis of the arms and legs for about 6 hours. The patient had no confusion, she was able to speak during the episode. The patient did have some dizziness and pressure sensation the head. The patient underwent a MRI study of the brain and cervical spine that was unremarkable. The patient has not had any problems controlling the bowels or the bladder. The patient denies numbness of the extremities. The patient had evidence of a sinus infection, and was treated with a Z-Pak. The patient still has some pressure and dizziness. The headache and dizziness sensations are coming on every 2 or 3 days. The patient returns to this office for an evaluation. The patient went back to the emergency room on 01/20/2014 with pressure sensation and dizziness. No paralysis  was noted at that time.   REVIEW OF SYSTEMS: Full 14 system review of systems performed and notable only for those listed, all others are neg:  Constitutional: chills Cardiovascular: N/A  Ear/Nose/Throat: N/A  Skin: N/A  Eyes: N/A  Respiratory: wheezing, shortness of breath Gastroitestinal: N/A  Hematology/Lymphatic: N/A  Endocrine: N/A Musculoskeletal:N/A  Allergy/Immunology: N/A  Neurological: dizziness Psychiatric: N/A   ALLERGIES: Allergies  Allergen Reactions  . Other Anaphylaxis    peanuts peanuts peanuts    HOME MEDICATIONS: Outpatient Prescriptions Prior to Visit  Medication Sig Dispense Refill  . albuterol (PROAIR HFA) 108 (90 BASE) MCG/ACT inhaler Inhale 2 puffs into the lungs every 6 (six) hours as needed for shortness of breath.       Marland Kitchen. aspirin 81 MG chewable tablet Chew 81 mg by mouth daily.      . budesonide-formoterol (SYMBICORT) 160-4.5 MCG/ACT inhaler Take 2 puffs first thing in am and then another 2 puffs about 12 hours later.  1 Inhaler  12  . cholecalciferol (VITAMIN D) 1000 UNITS tablet Take 1,000 Units by mouth daily.      . citalopram (CELEXA) 20 MG tablet Take 20 mg by mouth every evening.      . Dapsone (ACZONE) 5 % topical gel Apply 5 g topically as needed.      . fluticasone (FLONASE) 50 MCG/ACT nasal spray Place 1 spray into both nostrils as needed.      . montelukast (SINGULAIR) 10 MG tablet Take 10 mg by mouth at bedtime.      .Marland Kitchen  topiramate (TOPAMAX) 100 MG tablet One half tablet in the morning and one tablet in the evening  50 tablet  5  . tretinoin (RETIN-A) 0.1 % cream Apply 1 application topically at bedtime.      Marland Kitchen ketorolac (TORADOL) 60 MG/2ML SOLN injection Inject 60 mg into the muscle as needed.      . sulfamethoxazole-trimethoprim (SEPTRA) 400-80 MG per tablet Take 1 tablet by mouth 2 (two) times daily.  14 tablet  0   No facility-administered medications prior to visit.    PAST MEDICAL HISTORY: Past Medical History  Diagnosis  Date  . Asthma   . Hyperlipidemia   . Intracranial hypertension   . TIA (transient ischemic attack)   . Pseudotumor cerebri 02/07/2014    PAST SURGICAL HISTORY: Past Surgical History  Procedure Laterality Date  . Cholecystectomy  2012    FAMILY HISTORY: Family History  Problem Relation Age of Onset  . Diabetes Father   . Breast cancer Paternal Aunt   . Hyperlipidemia Other   . Sleep apnea Other     SOCIAL HISTORY: History   Social History  . Marital Status: Single    Spouse Name: N/A    Number of Children: 0  . Years of Education: BA   Occupational History  . Customer Service [employer]  .  Other    [employer]    Social History Main Topics  . Smoking status: Former Smoker    Types: Cigars    Quit date: 03/13/2013  . Smokeless tobacco: Never Used     Comment: pt. stopped smoking cigars as of last visit  . Alcohol Use: Yes     Comment: social   . Drug Use: No  . Sexual Activity: Yes    Birth Control/ Protection: None   Other Topics Concern  . Not on file   Social History Narrative   Patient lives at home alone.    Patient is single.    Patient has no children.    Patient has a BA.    Patient is currently working at [employer] full time     PHYSICAL EXAM  Filed Vitals:   03/02/14 1020  BP: 95/68  Pulse: 98  Height: 5' 6.5" (1.689 m)  Weight: 226 lb (102.513 kg)   Body mass index is 35.94 kg/(m^2).  Generalized: Well developed, obese female in no acute distress  Head: normocephalic and atraumatic,. Oropharynx benign . TMs clear. Mild tenderness over maxillary sinuses  Neurological examination   Mentation: Alert oriented to time, place, history taking. Follows all commands speech and language fluent  Cranial nerve II-XII: Fundoscopic exam reveals mild blurring of disc margins.Visual acuity 20/30 right , 20/20 left. Pupils were equal round reactive to light extraocular movements were full, visual field were full on confrontational test. Facial  sensation and strength were normal. hearing was intact to finger rubbing bilaterally. Uvula tongue midline. head turning and shoulder shrug were normal and symmetric.Tongue protrusion into cheek strength was normal. Motor: normal bulk and tone, full strength in the BUE, BLE,  No focal weakness Sensory: normal and symmetric to light touch, pinprick, and  vibration  Coordination: finger-nose-finger, heel-to-shin bilaterally, no dysmetria Reflexes: Brachioradialis 2/2, biceps 2/2, triceps 2/2, patellar 2/2, Achilles 2/2, plantar responses were flexor bilaterally. Gait and Station: Rising up from seated position without assistance, normal stance,  moderate stride, good arm swing, smooth turning, able to perform tiptoe, and heel walking without difficulty. Tandem gait is steady  DIAGNOSTIC DATA (LABS, IMAGING, TESTING) -  I reviewed patient records, labs, notes, testing and imaging myself where available.  Lab Results  Component Value Date   WBC 11.2* 12/29/2013   HGB 11.7* 12/29/2013   HCT 37.1 12/29/2013   MCV 86.3 12/29/2013   PLT 329 12/29/2013      Component Value Date/Time   NA 142 12/29/2013 2108   K 4.0 12/29/2013 2108   CL 104 12/29/2013 2108   CO2 25 12/29/2013 2108   GLUCOSE 88 12/29/2013 2108   BUN 9 12/29/2013 2108   CREATININE 0.75 12/29/2013 2108   CALCIUM 9.2 12/29/2013 2108   PROT 7.1 05/03/2011 1018   ALBUMIN 3.8 05/03/2011 1018   AST 34 05/03/2011 1018   ALT 40* 05/03/2011 1018   ALKPHOS 176* 05/03/2011 1018   BILITOT 0.3 05/03/2011 1018   GFRNONAA >90 12/29/2013 2108   GFRAA >90 12/29/2013 2108    ASSESSMENT AND PLAN  33 y.o. year old female  has a past medical history of Asthma; Hyperlipidemia; Intracranial hypertension; and  Pseudotumor cerebri (02/07/2014). here to follow up. Continues to have episodes of vertigo. MRI of the brain was unremarkable except for presence of acute sinus infection.   Discussed with Dr. Anne Hahn Complete antibiotics and prednisone Dosepak If symptoms continue after  conclusion of meds we will get a CT of the sinuses If that is positive we will send you to ENT Followup 3/6 months  Nilda Riggs, [facility], [facility], APRN  [facility] [address] [place], [place] [address] [phone]

## 2014-03-02 NOTE — Patient Instructions (Signed)
Discussed with Dr. Anne HahnWillis Complete antibiotics and prednisone Dosepak If symptoms continue we will get a CT of the sinuses If that is positive we will send you to ENT Followup 3/6 months

## 2014-03-02 NOTE — Progress Notes (Signed)
I have read the note, and I agree with the clinical assessment and plan.  Zakaria Fromer KEITH   

## 2014-05-02 ENCOUNTER — Encounter: Payer: Self-pay | Admitting: Nurse Practitioner

## 2014-05-02 ENCOUNTER — Other Ambulatory Visit: Payer: Self-pay | Admitting: Internal Medicine

## 2014-05-03 ENCOUNTER — Telehealth: Payer: Self-pay | Admitting: Nurse Practitioner

## 2014-05-03 DIAGNOSIS — R51 Headache: Secondary | ICD-10-CM

## 2014-05-03 DIAGNOSIS — J329 Chronic sinusitis, unspecified: Secondary | ICD-10-CM

## 2014-05-03 NOTE — Telephone Encounter (Signed)
Patient called to rs appt from 6/16.  I gave her the next available time which was in September and she stated she could wait that long.  Please call patient with earlier appt date.  Thanks

## 2014-05-03 NOTE — Telephone Encounter (Signed)
Called pt to resch appt from 06/07/14 and made an appt with Eber Jonesarolyn, NP on 06/28/14. Pt stated that she was still having problems and that base on OV notes from 03/02/14, pt stated that she would like to have a CT of the sinuses done. Please advise.

## 2014-05-04 ENCOUNTER — Other Ambulatory Visit: Payer: Self-pay | Admitting: Neurology

## 2014-05-04 NOTE — Telephone Encounter (Signed)
Order is in the system. It will need PA prior to setting up. Please call the patient

## 2014-05-04 NOTE — Telephone Encounter (Signed)
Called pt and left message informing her that the CT was ordered and someone should be calling to get that appt set up and if she has any other problems, questions or concerns to call the office.

## 2014-05-05 NOTE — Telephone Encounter (Signed)
Called patient to verify insurance or  in order to proceed w/ CT scan. Insurance company on file is reporting plan terminated as of 03/22/14. Left message to return call.

## 2014-05-05 NOTE — Telephone Encounter (Signed)
Spoke to patient. She stated she has insurance through company we have on file and should be active. Advised patient to contact insurance company and return call with active info. Patient agreed.

## 2014-05-12 NOTE — Telephone Encounter (Signed)
Attempted to reach patient several times to see if she has spoke with her insurance company to confirm active. No answer. Will close referral.

## 2014-06-07 ENCOUNTER — Ambulatory Visit: Payer: No Typology Code available for payment source | Admitting: Nurse Practitioner

## 2014-06-07 ENCOUNTER — Ambulatory Visit (INDEPENDENT_AMBULATORY_CARE_PROVIDER_SITE_OTHER): Payer: BC Managed Care – PPO | Admitting: Internal Medicine

## 2014-06-07 ENCOUNTER — Encounter: Payer: Self-pay | Admitting: Internal Medicine

## 2014-06-07 VITALS — BP 102/62 | HR 78 | Temp 98.7°F | Ht 66.0 in | Wt 234.4 lb

## 2014-06-07 DIAGNOSIS — J45909 Unspecified asthma, uncomplicated: Secondary | ICD-10-CM

## 2014-06-07 MED ORDER — PREDNISONE 10 MG PO TABS: ORAL_TABLET | ORAL | Status: DC

## 2014-06-07 MED ORDER — MOMETASONE FURO-FORMOTEROL FUM 200-5 MCG/ACT IN AERO: INHALATION_SPRAY | RESPIRATORY_TRACT | Status: DC

## 2014-06-07 MED ORDER — ALBUTEROL SULFATE HFA 108 (90 BASE) MCG/ACT IN AERS: 2.0000 | INHALATION_SPRAY | RESPIRATORY_TRACT | Status: AC | PRN

## 2014-06-07 NOTE — Progress Notes (Addendum)
Subjective:    Patient ID: Nancy Buchanan, female    DOB: 10/07/1981  MRN: [id number]    Brief patient profile:  33 yobf quit smoking  reports asthma in middle school used inhaler prn until college then stopped using inhalers p college then started smoking age 33 with no need any inhalers until 2012 and then problems with freq severe flares so referred 03/12/2013 by Dr Justice BritainLisa Stevens for asthma eval.   History of Present Illness  03/12/2013 1st pulmonary cc poorly controlled asthma with list er trip 12/2012 and 5 h nebulizer but no admit = better at ov but still sob with strenous activity like steps. Min cough> clumps white, albuterol seems to help and using once daily despite advair/ singular. rec Symbicort 160 Take 2 puffs first thing in am and then another 2 puffs about 12 hours later and stop advair Only use your albuterol (proaire) as a rescue medication  The key is to stop smoking completely    04/13/2013 f/u ov/Wert re asthma quit smoking completely  Chief Complaint  Patient presents with  . Office Visit    PFT Today-------Pt. reports that her breathing is doing better.  Pt.  denies any sob, coughing, wheezing or tightness in chest.  No need at all for saba though not aerobically active rec Symbicort 160 Take 2 puffs first thing in am and then another 2 puffs about 12 hours later.  Only use your albuterol prn   06/07/2014 f/u ov/Wert re: asthma/ off symbicort x 1 week prior to flare / no need for rescue prior to running out / no Psychologist, sport and exerciseinsurance Chief Complaint  Patient presents with  . Follow-up    Pt c/o wheezing, cough, and increased SOB for the past 6 days. Cough is prod with minimal yellow sputum.     No obvious daytime variabilty or assoc chronic cough or cp or chest tightness, subjective wheeze overt sinus or hb symptoms. No unusual exp hx or h/o childhood pna/ asthma or premature birth to his knowledge.   Sleeping ok without nocturnal  or early am exacerbation  of respiratory   c/o's or need for noct saba. Also denies any obvious fluctuation of symptoms with weather or environmental changes or other aggravating or alleviating factors except as outlined above.  Current Medications, Allergies, Past Medical History, Past Surgical History, Family History, and Social History were reviewed in [facility] electronic medical record.  ROS  The following are not active complaints unless bolded sore throat, dysphagia, dental problems, itching, sneezing,  nasal congestion or excess/ purulent secretions, ear ache,   fever, chills, sweats, unintended wt loss, pleuritic or exertional cp, hemoptysis,  orthopnea pnd or leg swelling, presyncope, palpitations, heartburn, abdominal pain, anorexia, nausea, vomiting, diarrhea  or change in bowel or urinary habits, change in stools or urine, dysuria,hematuria,  rash, arthralgias, visual complaints, headache, numbness weakness or ataxia or problems with walking or coordination,  change in mood/affect or memory.           Objective:   Physical Exam  04/13/2013       227 > 06/07/2014   234 Wt Readings from Last 3 Encounters:  03/12/13 220 lb 9.6 oz (100.064 kg)  01/31/13 221 lb (100.245 kg)  02/09/12 213 lb (96.616 kg)    HEENT: nl dentition, turbinates, and orophanx. Nl external ear canals without cough reflex   NECK :  without JVD/Nodes/TM/ nl carotid upstrokes bilaterally   LUNGS: no acc muscle use, mid exp wheeze bilaterally   CV:  RRR  no s3 or murmur or increase in P2, no edema   ABD:  soft and nontender with nl excursion in the supine position. No bruits or organomegaly, bowel sounds nl  MS:  warm without deformities, calf tenderness, cyanosis or clubbing      pcxr 12/25/12  Lower lung volumes with increased accentuation of the interstitial  markings, likely secondary to reactive airways disease or  superimposed viral infection. No evidence of pneumonia.     Assessment & Plan:   Outpatient Encounter Prescriptions  as of 06/07/2014  Medication Sig  . albuterol (PROAIR HFA) 108 (90 BASE) MCG/ACT inhaler Inhale 2 puffs into the lungs every 4 (four) hours as needed for shortness of breath.  Marland Kitchen. aspirin 81 MG chewable tablet Chew 81 mg by mouth daily.  . cholecalciferol (VITAMIN D) 1000 UNITS tablet Take 1,000 Units by mouth daily.  . citalopram (CELEXA) 20 MG tablet Take 20 mg by mouth every evening.  . Dapsone (ACZONE) 5 % topical gel Apply 5 g topically as needed.  . fluticasone (FLONASE) 50 MCG/ACT nasal spray Place 1 spray into both nostrils as needed.  . meclizine (ANTIVERT) 25 MG tablet Take 25 mg by mouth 3 (three) times daily as needed.   . montelukast (SINGULAIR) 10 MG tablet Take 10 mg by mouth at bedtime.  . topiramate (TOPAMAX) 100 MG tablet One half tablet in the morning and one tablet in the evening  . tretinoin (RETIN-A) 0.1 % cream Apply 1 application topically at bedtime.  . [DISCONTINUED] albuterol (PROAIR HFA) 108 (90 BASE) MCG/ACT inhaler Inhale 2 puffs into the lungs every 6 (six) hours as needed for shortness of breath.   . mometasone-formoterol (DULERA) 200-5 MCG/ACT AERO Take 2 puffs first thing in am and then another 2 puffs about 12 hours later.  . predniSONE (DELTASONE) 10 MG tablet Take  4 each am x 2 days,   2 each am x 2 days,  1 each am x 2 days and stop  . [DISCONTINUED] budesonide-formoterol (SYMBICORT) 160-4.5 MCG/ACT inhaler Take 2 puffs first thing in am and then another 2 puffs about 12 hours later.  . [DISCONTINUED] cefdinir (OMNICEF) 300 MG capsule 300 mg.  . [DISCONTINUED] predniSONE (STERAPRED UNI-PAK) 10 MG tablet

## 2014-06-07 NOTE — Patient Instructions (Addendum)
Prednisone 10 mg take  4 each am x 2 days,   2 each am x 2 days,  1 each am x 2 days and stop.  Plan A = automatic dulera 200 Take 2 puffs first thing in am and then another 2 puffs about 12 hours later.    Plan B = backup = proair (Red/albuterol) up to every 4 hours if needed  Plan C = contingency = nebulizer albuterol up to every 4 only if you try proair first and it doesn't work  Plan D = Doctor, call if needing Plan C more than rarely  Plan  E= ER, last resort  Your primary physician can refill your medications

## 2014-06-09 NOTE — Assessment & Plan Note (Addendum)
-   PFT's 04/13/13 > wnl - hfa 75% p coaching 06/07/14   DDX of  difficult airways management all start with A and  include Adherence, Ace Inhibitors, Acid Reflux, Active Sinus Disease, Alpha 1 Antitripsin deficiency, Anxiety masquerading as Airways dz,  ABPA,  allergy(esp in young), Aspiration (esp in elderly), Adverse effects of DPI,  Active smokers, plus two Bs  = Bronchiectasis and Beta blocker use..and one C= CHF  Adherence is always the initial "prime suspect" and is a multilayered concern that requires a "trust but verify" approach in every patient - starting with knowing how to use medications, especially inhalers, correctly, keeping up with refills and understanding the fundamental difference between maintenance and prns vs those medications only taken for a very short course and then stopped and not refilled.  - cost issues reviewed, given 2 months of dulera 200 until works this out - The proper method of use, as well as anticipated side effects, of a metered-dose inhaler are discussed and demonstrated to the patient. Improved effectiveness after extensive coaching during this visit to a level of approximately  75%   ? Allergy > cont singulair and add Prednisone 10 mg take  4 each am x 2 days,   2 each am x 2 days,  1 each am x 2 days and stop   See instructions for specific recommendations which were reviewed directly with the patient who was given a copy with highlighter outlining the key components.

## 2014-06-12 IMAGING — CT CT CERVICAL SPINE W/O CM
3 of 4 series · 12 of 33 positions shown, 14 images · non-contrast
Comparison: None.

CLINICAL DATA: Bilateral extremity weakness and numbness

EXAM:
CT CERVICAL SPINE WITHOUT CONTRAST
TECHNIQUE: Multidetector CT imaging of the cervical spine was performed without
intravenous contrast. Multiplanar CT image reconstructions were also
generated.

[Series 7: coronals · coronal · 0.34mm/px · 3 of 49 slices shown]
[im 10/49  bone]
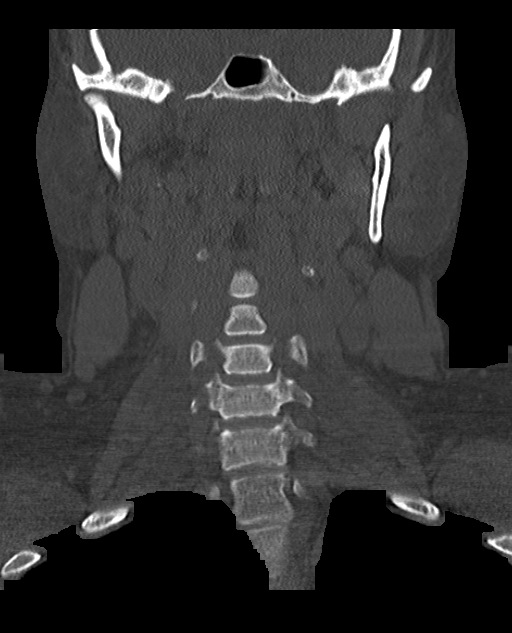
[im 20/49  bone]
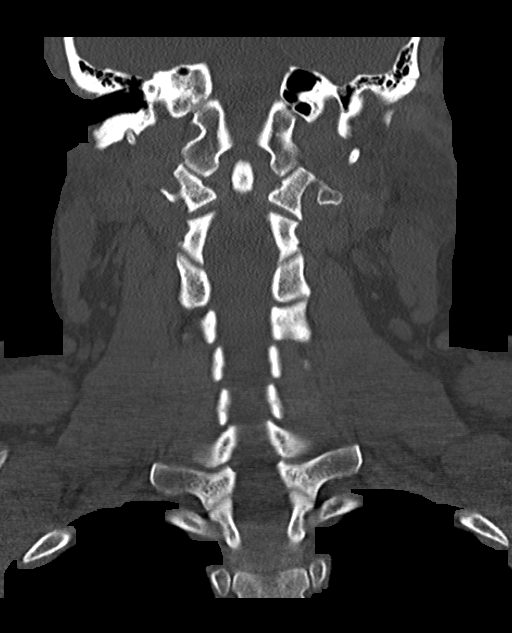
[im 29/49  bone]
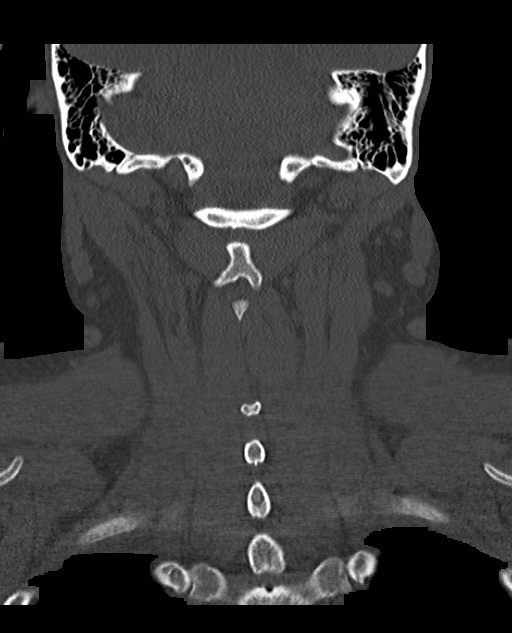

[Series 8: sagittals · sagittal · 0.31mm/px · 5 of 61 slices shown, 6 images]
[im 21/61  bone]
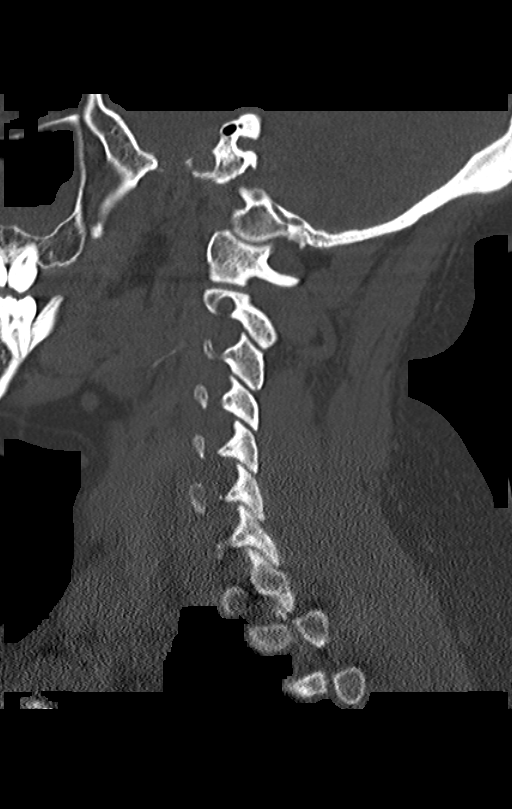
[im 26/61  bone]
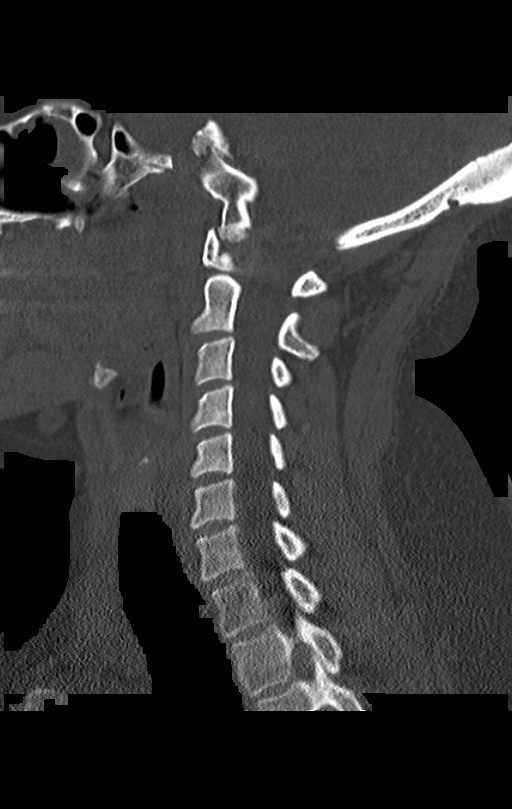
[im 31/61  soft-tissue]
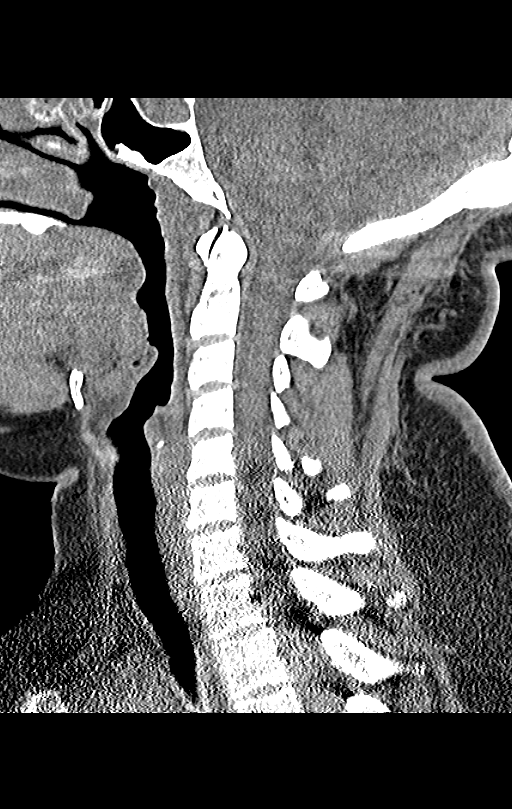
[im 31/61  bone]
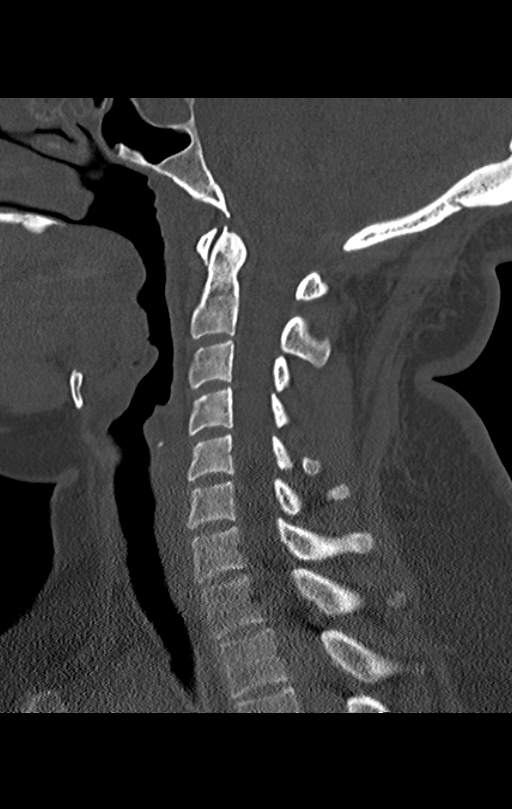
[im 36/61  bone]
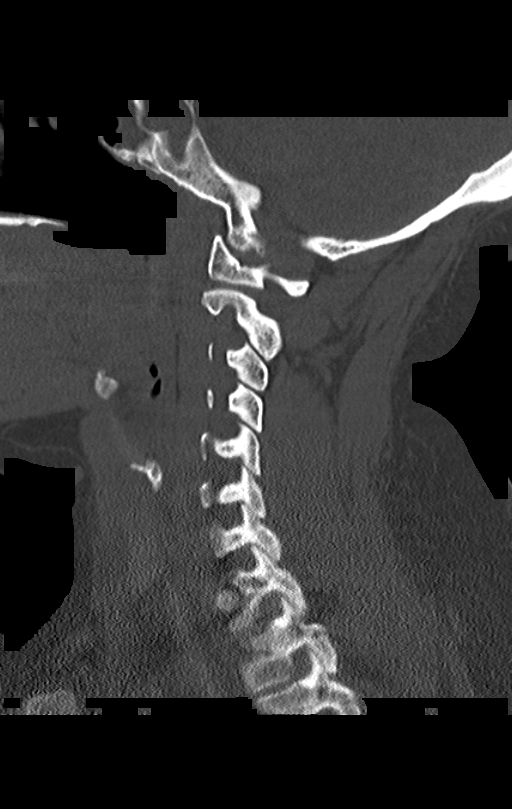
[im 41/61  bone]
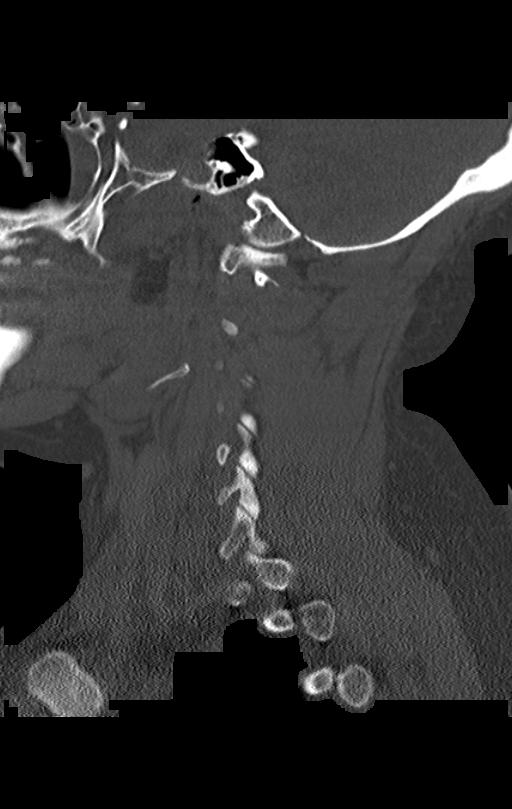

[Series 9: orthogonals · axial · 0.15mm/px · z∈[-270,-175]mm · 4 of 81 slices shown, 5 images]
[im 17/81  soft-tissue]
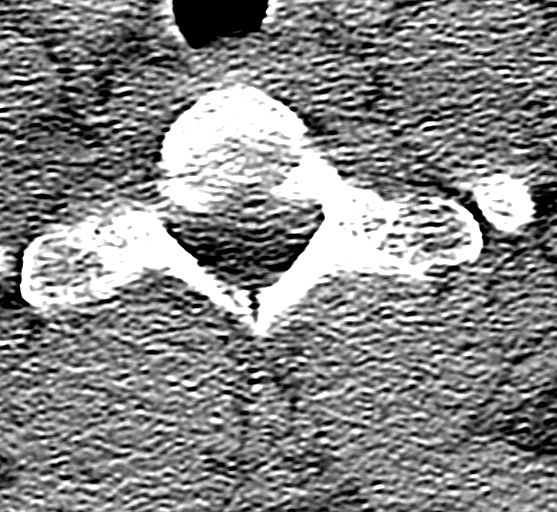
[im 17/81  bone]
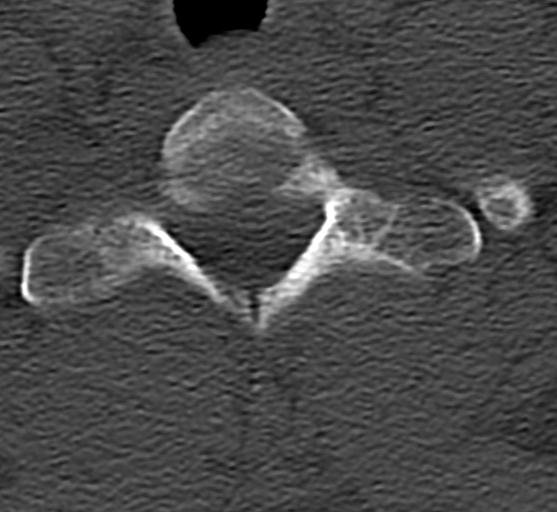
[im 33/81  bone]
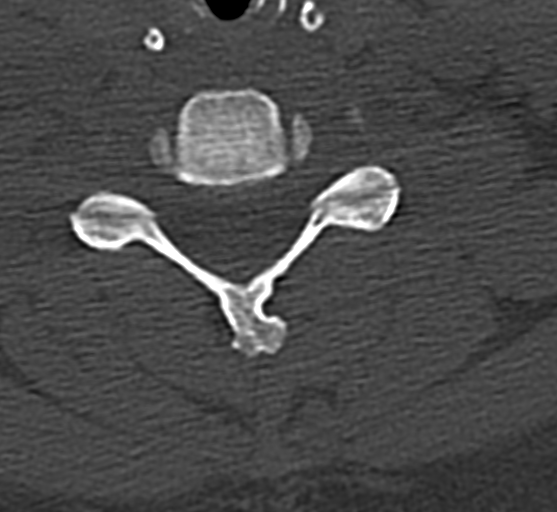
[im 49/81  bone]
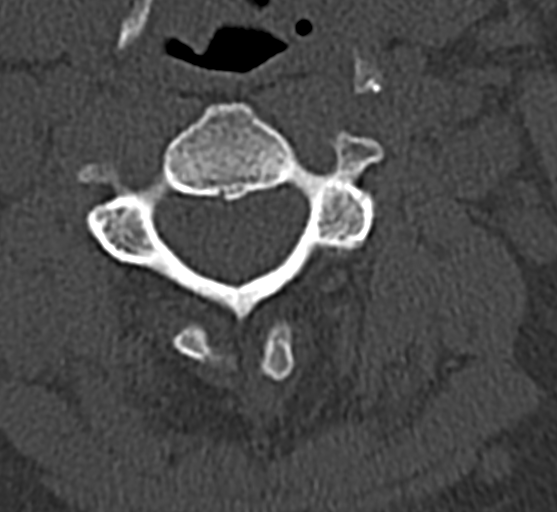
[im 65/81  bone]
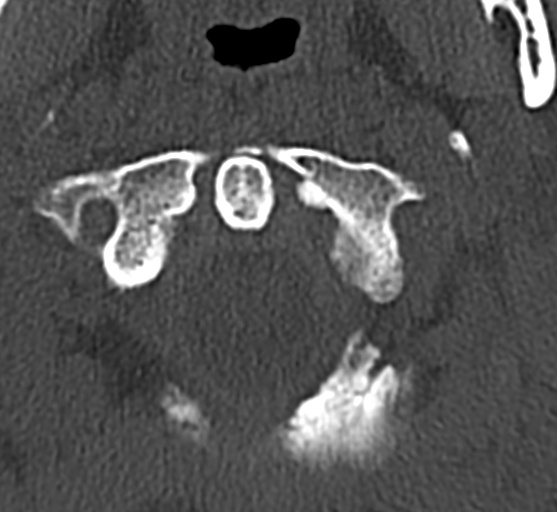

[12 of 33 positions shown; findings below may reference images not displayed]

FINDINGS: There is no fracture or spondylolisthesis. Prevertebral soft tissues
and predental space regions are normal. Disc spaces appear intact.
There is no appreciable facet arthropathy. No nerve root edema or
effacement. No disc extrusion or stenosis. There is no obvious mass
or fluid collection.

The visualized upper lobes appear normal. Note that there is
extensive paranasal sinus disease with air-fluid levels in both
maxillary antra. Mastoid air cells are clear.
IMPRESSION: Extensive paranasal sinus disease.

No fracture or spondylolisthesis. No disc extrusion. No arthropathy
appreciable.

There is no demonstrable fluid or lesion involving the visualized
cord region. It should be noted that MR is much more sensitive than
noncontrast enhanced CT for lesions involving the cord or epidural
regions.

## 2014-06-28 ENCOUNTER — Telehealth: Payer: Self-pay | Admitting: Nurse Practitioner

## 2014-06-28 ENCOUNTER — Ambulatory Visit: Payer: Self-pay | Admitting: Nurse Practitioner

## 2014-06-28 NOTE — Telephone Encounter (Signed)
No show for scheduled appt 

## 2014-07-28 NOTE — Telephone Encounter (Signed)
Noted  

## 2014-08-22 ENCOUNTER — Encounter (HOSPITAL_BASED_OUTPATIENT_CLINIC_OR_DEPARTMENT_OTHER): Payer: Self-pay | Admitting: Emergency Medicine

## 2014-08-22 ENCOUNTER — Emergency Department (HOSPITAL_BASED_OUTPATIENT_CLINIC_OR_DEPARTMENT_OTHER)
Admission: EM | Admit: 2014-08-22 | Discharge: 2014-08-22 | Disposition: A | Discharge status: Home / Self Care | Payer: MEDICAID | Attending: Emergency Medicine | Admitting: Emergency Medicine

## 2014-08-22 DIAGNOSIS — Z79899 Other long term (current) drug therapy: Secondary | ICD-10-CM | POA: Insufficient documentation

## 2014-08-22 DIAGNOSIS — Z8673 Personal history of transient ischemic attack (TIA), and cerebral infarction without residual deficits: Secondary | ICD-10-CM | POA: Insufficient documentation

## 2014-08-22 DIAGNOSIS — Z862 Personal history of diseases of the blood and blood-forming organs and certain disorders involving the immune mechanism: Secondary | ICD-10-CM | POA: Insufficient documentation

## 2014-08-22 DIAGNOSIS — Z3202 Encounter for pregnancy test, result negative: Secondary | ICD-10-CM | POA: Insufficient documentation

## 2014-08-22 DIAGNOSIS — Z87891 Personal history of nicotine dependence: Secondary | ICD-10-CM | POA: Insufficient documentation

## 2014-08-22 DIAGNOSIS — IMO0002 Reserved for concepts with insufficient information to code with codable children: Secondary | ICD-10-CM | POA: Insufficient documentation

## 2014-08-22 DIAGNOSIS — Z8669 Personal history of other diseases of the nervous system and sense organs: Secondary | ICD-10-CM | POA: Insufficient documentation

## 2014-08-22 DIAGNOSIS — Z9089 Acquired absence of other organs: Secondary | ICD-10-CM | POA: Insufficient documentation

## 2014-08-22 DIAGNOSIS — R300 Dysuria: Secondary | ICD-10-CM | POA: Insufficient documentation

## 2014-08-22 DIAGNOSIS — N390 Urinary tract infection, site not specified: Secondary | ICD-10-CM

## 2014-08-22 DIAGNOSIS — Z7982 Long term (current) use of aspirin: Secondary | ICD-10-CM | POA: Insufficient documentation

## 2014-08-22 DIAGNOSIS — J45909 Unspecified asthma, uncomplicated: Secondary | ICD-10-CM | POA: Insufficient documentation

## 2014-08-22 DIAGNOSIS — Z8639 Personal history of other endocrine, nutritional and metabolic disease: Secondary | ICD-10-CM | POA: Insufficient documentation

## 2014-08-22 LAB — URINE MICROSCOPIC-ADD ON

## 2014-08-22 LAB — URINALYSIS, ROUTINE W REFLEX MICROSCOPIC
Bilirubin Urine: NEGATIVE
Glucose, UA: NEGATIVE mg/dL
KETONES UR: NEGATIVE mg/dL
NITRITE: POSITIVE — AB
PROTEIN: 100 mg/dL — AB
Specific Gravity, Urine: 1.022 (ref 1.005–1.030)
UROBILINOGEN UA: 2 mg/dL — AB (ref 0.0–1.0)
pH: 6 (ref 5.0–8.0)

## 2014-08-22 LAB — PREGNANCY, URINE: Preg Test, Ur: NEGATIVE

## 2014-08-22 MED ORDER — LIDOCAINE HCL (PF) 1 % IJ SOLN
INTRAMUSCULAR | Status: AC
  Administered 2014-08-22: 12:00:00
  Filled 2014-08-22: qty 5

## 2014-08-22 MED ORDER — CEFTRIAXONE SODIUM 1 G IJ SOLR
1.0000 g | Freq: Once | INTRAMUSCULAR | Status: AC
Start: 2014-08-22 — End: 2014-08-22
  Administered 2014-08-22: 1 g via INTRAMUSCULAR
  Filled 2014-08-22: qty 10

## 2014-08-22 MED ORDER — CETIRIZINE HCL 5 MG/5ML PO SYRP: 5.0000 mg | ORAL_SOLUTION | Freq: Every day | ORAL | Status: DC

## 2014-08-22 MED ORDER — CIPROFLOXACIN HCL 500 MG PO TABS: 500.0000 mg | ORAL_TABLET | Freq: Two times a day (BID) | ORAL | Status: DC

## 2014-08-22 NOTE — ED Provider Notes (Signed)
CSN: [id number]     Arrival date & time 08/22/14  0913 History   First MD Initiated Contact with Patient 08/22/14 0920     Chief Complaint  Patient presents with  . Dysuria     (Consider location/radiation/quality/duration/timing/severity/associated sxs/prior Treatment) The history is provided by the patient.  Nancy Buchanan is a 33 y.o. female hx of HL, TIA, UTI, pseudotumor cerebri here with lower abdominal pain, dysuria. She has suprapubic pain for the last week. Associated with dysuria. Had an episode of hematuria several days ago. LMP was 2 weeks ago. No vomiting. No flank or back pain. No fevers. Hx of UTI and this is similar to previous.    Past Medical History  Diagnosis Date  . Asthma   . Hyperlipidemia   . Intracranial hypertension   . TIA (transient ischemic attack)   . Pseudotumor cerebri 02/07/2014   Past Surgical History  Procedure Laterality Date  . Cholecystectomy  2012   Family History  Problem Relation Age of Onset  . Diabetes Father   . Breast cancer Paternal Aunt   . Hyperlipidemia Other   . Sleep apnea Other    History  Substance Use Topics  . Smoking status: Former Smoker    Types: Cigars    Quit date: 03/13/2013  . Smokeless tobacco: Never Used     Comment: pt. stopped smoking cigars as of last visit  . Alcohol Use: Yes     Comment: social    OB History   Grav Para Term Preterm Abortions TAB SAB Ect Mult Living                 Review of Systems  Gastrointestinal: Positive for abdominal pain.  Genitourinary: Positive for dysuria.  All other systems reviewed and are negative.     Allergies  Other  Home Medications   Prior to Admission medications   Medication Sig Start Date End Date Taking? Authorizing Provider  albuterol (PROAIR HFA) 108 (90 BASE) MCG/ACT inhaler Inhale 2 puffs into the lungs every 4 (four) hours as needed for shortness of breath. 06/07/14   Nyoka Cowden, MD  aspirin 81 MG chewable tablet Chew 81 mg by mouth  daily.    Historical Provider, MD  cholecalciferol (VITAMIN D) 1000 UNITS tablet Take 1,000 Units by mouth daily.    Historical Provider, MD  citalopram (CELEXA) 20 MG tablet Take 20 mg by mouth every evening.    Historical Provider, MD  Dapsone (ACZONE) 5 % topical gel Apply 5 g topically as needed. 04/10/12   Historical Provider, MD  fluticasone (FLONASE) 50 MCG/ACT nasal spray Place 1 spray into both nostrils as needed. 03/08/13   Historical Provider, MD  meclizine (ANTIVERT) 25 MG tablet Take 25 mg by mouth 3 (three) times daily as needed.  02/22/14   Historical Provider, MD  mometasone-formoterol (DULERA) 200-5 MCG/ACT AERO Take 2 puffs first thing in am and then another 2 puffs about 12 hours later. 06/07/14   Nyoka Cowden, MD  montelukast (SINGULAIR) 10 MG tablet Take 10 mg by mouth at bedtime.    Historical Provider, MD  predniSONE (DELTASONE) 10 MG tablet Take  4 each am x 2 days,   2 each am x 2 days,  1 each am x 2 days and stop 06/07/14   Nyoka Cowden, MD  topiramate (TOPAMAX) 100 MG tablet One half tablet in the morning and one tablet in the evening 02/07/14   York Spaniel, MD  tretinoin (RETIN-A) 0.1 %  cream Apply 1 application topically at bedtime.    Historical Provider, MD   BP 102/74  Pulse 80  Temp(Src) 98.5 F (36.9 C)  Resp 20  Wt 234 lb (106.142 kg)  SpO2 99% Physical Exam  Nursing note and vitals reviewed. Constitutional: She is oriented to person, place, and time. She appears well-developed and well-nourished.  HENT:  Head: Normocephalic.  Mouth/Throat: Oropharynx is clear and moist.  Eyes: Conjunctivae and EOM are normal. Pupils are equal, round, and reactive to light.  Neck: Normal range of motion. Neck supple.  Cardiovascular: Normal rate, regular rhythm and normal heart sounds.   Pulmonary/Chest: Effort normal and breath sounds normal. No respiratory distress. She has no wheezes. She has no rales.  Abdominal: Soft. Bowel sounds are normal.  + mild suprapubic  tenderness, no rebound. No RLQ tenderness. No CVAT   Musculoskeletal: Normal range of motion. She exhibits no edema and no tenderness.  Neurological: She is alert and oriented to person, place, and time. No cranial nerve deficit. Coordination normal.  Skin: Skin is warm and dry.  Psychiatric: She has a normal mood and affect. Her behavior is normal. Judgment and thought content normal.    ED Course  Procedures (including critical care time) Labs Review Labs Reviewed  URINALYSIS, ROUTINE W REFLEX MICROSCOPIC - Abnormal; Notable for the following:    Color, Urine ORANGE (*)    APPearance TURBID (*)    Hgb urine dipstick MODERATE (*)    Protein, ur 100 (*)    Urobilinogen, UA 2.0 (*)    Nitrite POSITIVE (*)    Leukocytes, UA LARGE (*)    All other components within normal limits  URINE MICROSCOPIC-ADD ON - Abnormal; Notable for the following:    Squamous Epithelial / LPF FEW (*)    Bacteria, UA MANY (*)    All other components within normal limits  URINE CULTURE  PREGNANCY, URINE    Imaging Review No results found.   EKG Interpretation None      MDM   Final diagnoses:  None    Nancy Buchanan is a 33 y.o. female here with dysuria, suprapubic pain. Likely UTI. Will get UA and reassess.   11:39 AM UA + UTI. Given ceftriaxone IM. Will dc home on cipro.    Richardean Canal, MD 08/22/14 [phone]

## 2014-08-22 NOTE — ED Notes (Signed)
Pt having dysuria with some lower abdominal pain for one week.  No fever.  No back pain.

## 2014-08-22 NOTE — Discharge Instructions (Signed)
Take cipro for 10 days.   Follow up with your doctor.   Return to ER if you have severe abdominal pain, fever, vomiting.

## 2014-08-25 LAB — URINE CULTURE

## 2014-08-26 ENCOUNTER — Telehealth (HOSPITAL_BASED_OUTPATIENT_CLINIC_OR_DEPARTMENT_OTHER): Payer: Self-pay | Admitting: Emergency Medicine

## 2014-08-26 NOTE — Telephone Encounter (Signed)
Post ED Visit - Positive Culture Follow-up  Culture report reviewed by antimicrobial stewardship pharmacist:  Wes Dulaney, Pharm.D., BCPS  Celedonio Miyamoto, [address].D., BCPS  Georgina Pillion, Pharm.D., BCPS  [place], [address].D., BCPS, AAHIVP  Estella Husk, Pharm.D., BCPS, AAHIVP  Carly Sabat, Pharm.D.  Enzo Bi, [address].D.  Positive urine culture >100,000 colonies/ml Proteus Mirabilis Treated with ciprofloxacin hcl  po tabs bid x 10 days, organism sensitive to the same and no further patient follow-up is required at this time.  Berle Mull 08/26/2014, 10:15 AM

## 2014-10-07 ENCOUNTER — Other Ambulatory Visit: Payer: Self-pay

## 2014-11-22 ENCOUNTER — Emergency Department (HOSPITAL_BASED_OUTPATIENT_CLINIC_OR_DEPARTMENT_OTHER)
Admission: EM | Admit: 2014-11-22 | Discharge: 2014-11-22 | Disposition: A | Discharge status: Home / Self Care | Payer: MEDICAID | Attending: Emergency Medicine | Admitting: Emergency Medicine

## 2014-11-22 ENCOUNTER — Encounter (HOSPITAL_BASED_OUTPATIENT_CLINIC_OR_DEPARTMENT_OTHER): Payer: Self-pay

## 2014-11-22 DIAGNOSIS — G5603 Carpal tunnel syndrome, bilateral upper limbs: Secondary | ICD-10-CM

## 2014-11-22 DIAGNOSIS — G5601 Carpal tunnel syndrome, right upper limb: Secondary | ICD-10-CM | POA: Insufficient documentation

## 2014-11-22 DIAGNOSIS — Z79899 Other long term (current) drug therapy: Secondary | ICD-10-CM | POA: Insufficient documentation

## 2014-11-22 DIAGNOSIS — Z87891 Personal history of nicotine dependence: Secondary | ICD-10-CM | POA: Insufficient documentation

## 2014-11-22 DIAGNOSIS — Z7951 Long term (current) use of inhaled steroids: Secondary | ICD-10-CM | POA: Insufficient documentation

## 2014-11-22 DIAGNOSIS — Z8639 Personal history of other endocrine, nutritional and metabolic disease: Secondary | ICD-10-CM | POA: Insufficient documentation

## 2014-11-22 DIAGNOSIS — Z8673 Personal history of transient ischemic attack (TIA), and cerebral infarction without residual deficits: Secondary | ICD-10-CM | POA: Insufficient documentation

## 2014-11-22 DIAGNOSIS — J45909 Unspecified asthma, uncomplicated: Secondary | ICD-10-CM | POA: Insufficient documentation

## 2014-11-22 DIAGNOSIS — Z7982 Long term (current) use of aspirin: Secondary | ICD-10-CM | POA: Insufficient documentation

## 2014-11-22 DIAGNOSIS — G5602 Carpal tunnel syndrome, left upper limb: Secondary | ICD-10-CM | POA: Insufficient documentation

## 2014-11-22 HISTORY — DX: Carpal tunnel syndrome, unspecified upper limb: G56.00

## 2014-11-22 MED ORDER — HYDROCODONE-ACETAMINOPHEN 5-325 MG PO TABS: 2.0000 | ORAL_TABLET | ORAL | Status: DC | PRN

## 2014-11-22 MED ORDER — PREDNISONE 10 MG PO TABS: ORAL_TABLET | ORAL | Status: DC

## 2014-11-22 NOTE — ED Notes (Signed)
C/o bilat hand pain since Saturday-denies injury-worse on right-hx of carpal tunnel/no surgery-braces and OTC meds no relief

## 2014-11-22 NOTE — Discharge Instructions (Signed)
Carpal Tunnel Syndrome The carpal tunnel is a narrow area located on the palm side of your wrist. The tunnel is formed by the wrist bones and ligaments. Nerves, blood vessels, and tendons pass through the carpal tunnel. Repeated wrist motion or certain diseases may cause swelling within the tunnel. This swelling pinches the main nerve in the wrist (median nerve) and causes the painful hand and arm condition called carpal tunnel syndrome. CAUSES   Repeated wrist motions.  Wrist injuries.  Certain diseases like arthritis, diabetes, alcoholism, hyperthyroidism, and kidney failure.  Obesity.  Pregnancy. SYMPTOMS   A "pins and needles" feeling in your fingers or hand, especially in your thumb, index and middle fingers.  Tingling or numbness in your fingers or hand.  An aching feeling in your entire arm, especially when your wrist and elbow are bent for long periods of time.  Wrist pain that goes up your arm to your shoulder.  Pain that goes down into your palm or fingers.  A weak feeling in your hands. DIAGNOSIS  Your health care provider will take your history and perform a physical exam. An electromyography test may be needed. This test measures electrical signals sent out by your nerves into the muscles. The electrical signals are usually slowed by carpal tunnel syndrome. You may also need X-rays. TREATMENT  Carpal tunnel syndrome may clear up by itself. Your health care provider may recommend a wrist splint or medicine such as a nonsteroidal anti-inflammatory medicine. Cortisone injections may help. Sometimes, surgery may be needed to free the pinched nerve.  HOME CARE INSTRUCTIONS   Take all medicine as directed by your health care provider. Only take over-the-counter or prescription medicines for pain, discomfort, or fever as directed by your health care provider.  If you were given a splint to keep your wrist from bending, wear it as directed. It is important to wear the splint at  night. Wear the splint for as long as you have pain or numbness in your hand, arm, or wrist. This may take 1 to 2 months.  Rest your wrist from any activity that may be causing your pain. If your symptoms are work-related, you may need to talk to your employer about changing to a job that does not require using your wrist.  Put ice on your wrist after long periods of wrist activity.  Put ice in a plastic bag.  Place a towel between your skin and the bag.  Leave the ice on for 15-20 minutes, 03-04 times a day.  Keep all follow-up visits as directed by your health care provider. This includes any orthopedic referrals, physical therapy, and rehabilitation. Any delay in getting necessary care could result in a delay or failure of your condition to heal. SEEK IMMEDIATE MEDICAL CARE IF:   You have new, unexplained symptoms.  Your symptoms get worse and are not helped or controlled with medicines. MAKE SURE YOU:   Understand these instructions.  Will watch your condition.  Will get help right away if you are not doing well or get worse. Document Released: 12/06/2000 Document Revised: 04/25/2014 Document Reviewed: 10/25/2011 ExitCare Patient Information 2015 [employer]. This information is not intended to replace advice given to you by your health care provider. Make sure you discuss any questions you have with your health care provider.  

## 2014-11-22 NOTE — ED Provider Notes (Signed)
CSN: [id number]     Arrival date & time 11/22/14  1237 History   First MD Initiated Contact with Patient 11/22/14 1320     Chief Complaint  Patient presents with  . Hand Pain     (Consider location/radiation/quality/duration/timing/severity/associated sxs/prior Treatment) Patient is a 33 y.o. female presenting with hand pain. The history is provided by the patient. No language interpreter was used.  Hand Pain This is a new problem. The current episode started in the past 7 days. The problem occurs constantly. The problem has been gradually worsening. Associated symptoms include joint swelling and myalgias. Associated symptoms comments: Hand pain. Nothing aggravates the symptoms. She has tried nothing for the symptoms. The treatment provided moderate relief.    Past Medical History  Diagnosis Date  . Asthma   . Hyperlipidemia   . Intracranial hypertension   . TIA (transient ischemic attack)   . Pseudotumor cerebri 02/07/2014  . Carpal tunnel syndrome    Past Surgical History  Procedure Laterality Date  . Cholecystectomy  2012   Family History  Problem Relation Age of Onset  . Diabetes Father   . Breast cancer Paternal Aunt   . Hyperlipidemia Other   . Sleep apnea Other    History  Substance Use Topics  . Smoking status: Former Smoker    Quit date: 03/13/2013  . Smokeless tobacco: Never Used  . Alcohol Use: Yes     Comment: social    OB History    No data available     Review of Systems  Musculoskeletal: Positive for myalgias and joint swelling.  All other systems reviewed and are negative.     Allergies  Other  Home Medications   Prior to Admission medications   Medication Sig Start Date End Date Taking? Authorizing Provider  albuterol (PROAIR HFA) 108 (90 BASE) MCG/ACT inhaler Inhale 2 puffs into the lungs every 4 (four) hours as needed for shortness of breath. 06/07/14   Nyoka CowdenMichael B Wert, MD  aspirin 81 MG chewable tablet Chew 81 mg by mouth daily.     Historical Provider, MD  citalopram (CELEXA) 20 MG tablet Take 20 mg by mouth every evening.    Historical Provider, MD  Dapsone (ACZONE) 5 % topical gel Apply 5 g topically as needed. 04/10/12   Historical Provider, MD  fluticasone (FLONASE) 50 MCG/ACT nasal spray Place 1 spray into both nostrils as needed. 03/08/13   Historical Provider, MD  meclizine (ANTIVERT) 25 MG tablet Take 25 mg by mouth 3 (three) times daily as needed.  02/22/14   Historical Provider, MD  mometasone-formoterol (DULERA) 200-5 MCG/ACT AERO Take 2 puffs first thing in am and then another 2 puffs about 12 hours later. 06/07/14   Nyoka CowdenMichael B Wert, MD  topiramate (TOPAMAX) 100 MG tablet One half tablet in the morning and one tablet in the evening 02/07/14   York Spanielharles K Willis, MD  tretinoin (RETIN-A) 0.1 % cream Apply 1 application topically at bedtime.    Historical Provider, MD   BP 129/78 mmHg  Temp(Src) 98.3 F (36.8 C) (Oral)  Resp 18  Ht 5\' 6"  (1.676 m)  Wt 239 lb (108.41 kg)  BMI 38.59 kg/m2  SpO2 95%  LMP 11/05/2014 Physical Exam  Constitutional: She is oriented to person, place, and time. She appears well-developed and well-nourished.  HENT:  Head: Normocephalic.  Musculoskeletal: She exhibits tenderness.  Tender bilat wrist positive tinnels  Neurological: She is alert and oriented to person, place, and time. She has normal reflexes.  Skin: Skin is warm.  Psychiatric: She has a normal mood and affect.  Nursing note and vitals reviewed.   ED Course  Procedures (including critical care time) Labs Review Labs Reviewed - No data to display  Imaging Review No results found.   EKG Interpretation None      MDM  Pt has a history of carpal tunnel on and off for 10 years.     Final diagnoses:  Carpal tunnel syndrome, bilateral    Prednisone taper Hydrocodone See your Physician for recheck in 3-4 days    Elson AreasLeslie K Ardelia Wrede, PA-C 11/22/14 1411  Warnell Foresterrey Wofford, MD 11/22/14 1547

## 2015-04-03 ENCOUNTER — Telehealth: Payer: Self-pay | Admitting: Internal Medicine

## 2015-04-03 NOTE — Telephone Encounter (Signed)
Samples have been left at the front desk for pick up. Pt is aware. Nothing further was needed. 

## 2015-08-30 ENCOUNTER — Telehealth: Payer: Self-pay | Admitting: Internal Medicine

## 2015-08-30 MED ORDER — MOMETASONE FURO-FORMOTEROL FUM 200-5 MCG/ACT IN AERO: INHALATION_SPRAY | RESPIRATORY_TRACT | Status: AC

## 2015-08-30 NOTE — Telephone Encounter (Signed)
Called and spoke with pt and she scheduled appt with MW for next Friday at 10:30.  She is aware that she has not been seen in 1 year and will need to keep this appt for further refills.  Refill of dulera has been sent to the [facility] [address] for 3 month supply.  Nothing further is needed.

## 2015-09-08 ENCOUNTER — Ambulatory Visit (INDEPENDENT_AMBULATORY_CARE_PROVIDER_SITE_OTHER): Payer: BLUE CROSS/BLUE SHIELD | Admitting: Internal Medicine

## 2015-09-08 ENCOUNTER — Encounter: Payer: Self-pay | Admitting: Internal Medicine

## 2015-09-08 VITALS — BP 130/80 | HR 82 | Ht 66.0 in | Wt 239.6 lb

## 2015-09-08 DIAGNOSIS — Z720 Tobacco use: Secondary | ICD-10-CM | POA: Diagnosis not present

## 2015-09-08 DIAGNOSIS — E669 Obesity, unspecified: Secondary | ICD-10-CM | POA: Diagnosis not present

## 2015-09-08 DIAGNOSIS — F17210 Nicotine dependence, cigarettes, uncomplicated: Secondary | ICD-10-CM

## 2015-09-08 DIAGNOSIS — J4530 Mild persistent asthma, uncomplicated: Secondary | ICD-10-CM

## 2015-09-08 NOTE — Progress Notes (Signed)
Subjective:    Patient ID: Nancy Buchanan, female    DOB: 28-Jun-1981  MRN: [id number]    Brief patient profile:  33 yobf quit smoking  reports asthma in middle school used inhaler prn until college then stopped using inhalers p college then started smoking age 34 with no need any inhalers until 2012 and then problems with freq severe flares so referred 03/12/2013 by Dr Justice Britain for asthma eval.   History of Present Illness  03/12/2013 1st pulmonary cc poorly controlled asthma with list er trip 12/2012 and 5 h nebulizer but no admit = better at ov but still sob with strenous activity like steps. Min cough> clumps white, albuterol seems to help and using once daily despite advair/ singular. rec Symbicort 160 Take 2 puffs first thing in am and then another 2 puffs about 12 hours later and stop advair Only use your albuterol (proaire) as a rescue medication  The key is to stop smoking completely    04/13/2013 f/u ov/Wert re asthma quit smoking completely  Chief Complaint  Patient presents with  . Office Visit    PFT Today-------Pt. reports that her breathing is doing better.  Pt.  denies any sob, coughing, wheezing or tightness in chest.  No need at all for saba though not aerobically active rec Symbicort 160 Take 2 puffs first thing in am and then another 2 puffs about 12 hours later.  Only use your albuterol prn   06/07/2014 f/u ov/Wert re: asthma/ off symbicort x 1 week prior to flare / no need for rescue prior to running out / no Psychologist, sport and exercise Complaint  Patient presents with  . Follow-up    Pt c/o wheezing, cough, and increased SOB for the past 6 days. Cough is prod with minimal yellow sputum.   rec Prednisone 10 mg take  4 each am x 2 days,   2 each am x 2 days,  1 each am x 2 days and stop. Plan A = automatic dulera 200 Take 2 puffs first thing in am and then another 2 puffs about 12 hours later.  Plan B = backup = proair (Red/albuterol) up to every 4 hours if needed Plan  C = contingency = nebulizer albuterol up to every 4 only if you try proair first and it doesn't work Plan D = Doctor, call if needing Plan C more than rarely Plan  E= ER, last resort     09/08/2015 f/u ov/Wert re: asthma/ smoker / maint rx with dulera 200 and no need for saba  Chief Complaint  Patient presents with  . Follow-up    Pt states that her breathing is doing well. She has not needed rescue inhaler recently. No new co's.    Not limited by breathing from desired activities  / not very active     No obvious daytime variabilty or assoc chronic cough or cp or chest tightness, subjective wheeze overt sinus or hb symptoms. No unusual exp hx or h/o childhood pna/ asthma or premature birth to his knowledge.   Sleeping ok without nocturnal  or early am exacerbation  of respiratory  c/o's or need for noct saba. Also denies any obvious fluctuation of symptoms with weather or environmental changes or other aggravating or alleviating factors except as outlined above.  Current Medications, Allergies, Past Medical History, Past Surgical History, Family History, and Social History were reviewed in [employer] record.  ROS  The following are not active complaints unless bolded sore throat, dysphagia,  dental problems, itching, sneezing,  nasal congestion or excess/ purulent secretions, ear ache,   fever, chills, sweats, unintended wt loss, pleuritic or exertional cp, hemoptysis,  orthopnea pnd or leg swelling, presyncope, palpitations, heartburn, abdominal pain, anorexia, nausea, vomiting, diarrhea  or change in bowel or urinary habits, change in stools or urine, dysuria,hematuria,  rash, arthralgias, visual complaints, headache, numbness weakness or ataxia or problems with walking or coordination,  change in mood/affect or memory.           Objective:   Physical Exam  04/13/2013       227 > 06/07/2014   234 > 09/08/2015   240   Wt Readings from Last 3 Encounters:   03/12/13 220 lb 9.6 oz (100.064 kg)  01/31/13 221 lb (100.245 kg)  02/09/12 213 lb (96.616 kg)    HEENT: nl dentition, turbinates, and orophanx. Nl external ear canals without cough reflex   NECK :  without JVD/Nodes/TM/ nl carotid upstrokes bilaterally   LUNGS: no acc muscle use,  Clear to A and P   CV:  RRR  no s3 or murmur or increase in P2, no edema   ABD:  soft and nontender with nl excursion in the supine position. No bruits or organomegaly, bowel sounds nl  MS:  warm without deformities, calf tenderness, cyanosis or clubbing          Assessment & Plan:

## 2015-09-08 NOTE — Patient Instructions (Signed)
No change in meds but if dulera costs you any money we can switch you to symbicort  160 which would be 0 copay if you want and it's basically the same med   The key is to stop smoking completely before smoking completely stops you!   Please schedule a follow up visit in 12 months but call sooner if needed

## 2015-09-10 DIAGNOSIS — E669 Obesity, unspecified: Secondary | ICD-10-CM | POA: Insufficient documentation

## 2015-09-10 NOTE — Assessment & Plan Note (Signed)

## 2015-09-10 NOTE — Assessment & Plan Note (Signed)
Body mass index is 38.69 kg/(m^2).  No results found for: TSH   Contributing to gerd tendency/ doe/reviewed need  achieve and maintain neg calorie balance > defer f/u primary care including intermittently monitoring thyroid status

## 2015-09-10 NOTE — Assessment & Plan Note (Signed)
-  PFT's 04/13/13 > wnl  - 09/08/2015  extensive coaching HFA effectiveness =    90%   Does well despite smoking (see sep a/p) on dulera 200 when she can afford to fill it  All goals of chronic asthma control met including optimal function and elimination of symptoms with minimal need for rescue therapy.  Contingencies discussed in full including contacting this office immediately if not controlling the symptoms using the rule of two's.     I had an extended discussion with the patient reviewing all relevant studies completed to date and  lasting 15 to 20 minutes of a 25 minute visit    1) The proper method of use, as well as anticipated side effects, of a metered-dose inhaler are discussed and demonstrated to the patient. Improved effectiveness after extensive coaching during this visit to a level of approximately  90%  2) Formulary restrictions will be an ongoing challenge for the forseable future and I would be happy to pick an alternative if the pt will first  provide me a list of them but pt  will need to return here for training for any new device that is required eg dpi vs hfa vs respimat.    In meantime we can always provide samples so the patient never runs out of any needed respiratory medications.   3)   Each maintenance medication was reviewed in detail including most importantly the difference between maintenance and prns and under what circumstances the prns are to be triggered using an action plan format that is not reflected in the computer generated alphabetically organized AVS.    Please see instructions for details which were reviewed in writing and the patient given a copy highlighting the part that I personally wrote and discussed at today's ov.   Marland Kitchen

## 2015-11-01 ENCOUNTER — Encounter (HOSPITAL_BASED_OUTPATIENT_CLINIC_OR_DEPARTMENT_OTHER): Payer: Self-pay

## 2015-11-01 ENCOUNTER — Emergency Department (HOSPITAL_BASED_OUTPATIENT_CLINIC_OR_DEPARTMENT_OTHER)
Admission: EM | Admit: 2015-11-01 | Discharge: 2015-11-01 | Disposition: A | Discharge status: Home / Self Care | Payer: BLUE CROSS/BLUE SHIELD | Attending: Emergency Medicine | Admitting: Emergency Medicine

## 2015-11-01 DIAGNOSIS — Z8673 Personal history of transient ischemic attack (TIA), and cerebral infarction without residual deficits: Secondary | ICD-10-CM | POA: Insufficient documentation

## 2015-11-01 DIAGNOSIS — J45909 Unspecified asthma, uncomplicated: Secondary | ICD-10-CM | POA: Insufficient documentation

## 2015-11-01 DIAGNOSIS — K649 Unspecified hemorrhoids: Secondary | ICD-10-CM

## 2015-11-01 DIAGNOSIS — Z7951 Long term (current) use of inhaled steroids: Secondary | ICD-10-CM | POA: Insufficient documentation

## 2015-11-01 DIAGNOSIS — Z79899 Other long term (current) drug therapy: Secondary | ICD-10-CM | POA: Insufficient documentation

## 2015-11-01 DIAGNOSIS — Z86011 Personal history of benign neoplasm of the brain: Secondary | ICD-10-CM | POA: Insufficient documentation

## 2015-11-01 DIAGNOSIS — Z87891 Personal history of nicotine dependence: Secondary | ICD-10-CM | POA: Insufficient documentation

## 2015-11-01 DIAGNOSIS — Z8669 Personal history of other diseases of the nervous system and sense organs: Secondary | ICD-10-CM | POA: Insufficient documentation

## 2015-11-01 MED ORDER — HYDROCORTISONE 2.5 % RE CREA
TOPICAL_CREAM | RECTAL | Status: AC
Start: 2015-11-01 — End: ?

## 2015-11-01 NOTE — ED Notes (Signed)
C/o rectal bleeding-started yesterday

## 2015-11-01 NOTE — ED Provider Notes (Signed)
CSN: [id number]     Arrival date & time 11/01/15  1807 History  By signing my name below, I, Nancy Buchanan, attest that this documentation has been prepared under the direction and in the presence of Geoffery Lyons, MD. Electronically Signed: Octavia Heir, ED Scribe. 11/01/2015. 6:28 PM.    Chief Complaint  Patient presents with  . Rectal Bleeding     The history is provided by the patient. No language interpreter was used.   HPI Comments: Nancy Buchanan is a 34 y.o. female who has a PMHx of hyperlipidemia, intracranial HTN, and TIA presents to the Emergency Department complaining of constant, gradual worsening rectal bleeding onset yesterday. Pt states she has severe pain when taking a bowel movement. Pt reports she had a bowel movement and suddenly after she was dripping bright red blood that became progressively worse throughout the day. She does not have a hx of hemorrhoids. Pt denies having a colonoscopy, fever, constipation and abdominal pain.  Past Medical History  Diagnosis Date  . Asthma   . Hyperlipidemia   . Intracranial hypertension   . TIA (transient ischemic attack)   . Pseudotumor cerebri 02/07/2014  . Carpal tunnel syndrome    Past Surgical History  Procedure Laterality Date  . Cholecystectomy  2012   Family History  Problem Relation Age of Onset  . Diabetes Father   . Breast cancer Paternal Aunt   . Hyperlipidemia Other   . Sleep apnea Other    Social History  Substance Use Topics  . Smoking status: Former Games developer  . Smokeless tobacco: Never Used  . Alcohol Use: Yes     Comment: social    OB History    No data available     Review of Systems  Constitutional: Negative for fever.  Gastrointestinal: Positive for anal bleeding. Negative for abdominal pain and constipation.  All other systems reviewed and are negative.     Allergies  Other  Home Medications   Prior to Admission medications   Medication Sig Start Date End Date Taking? Authorizing  Provider  albuterol (PROAIR HFA) 108 (90 BASE) MCG/ACT inhaler Inhale 2 puffs into the lungs every 4 (four) hours as needed for shortness of breath. 06/07/14   Nyoka Cowden, MD  citalopram (CELEXA) 20 MG tablet Take 20 mg by mouth every evening.    Historical Provider, MD  Dapsone (ACZONE) 5 % topical gel Apply 5 g topically as needed. 04/10/12   Historical Provider, MD  fluticasone (FLONASE) 50 MCG/ACT nasal spray Place 1 spray into both nostrils as needed. 03/08/13   Historical Provider, MD  meclizine (ANTIVERT) 25 MG tablet Take 25 mg by mouth 3 (three) times daily as needed.  02/22/14   Historical Provider, MD  mometasone-formoterol (DULERA) 200-5 MCG/ACT AERO Take 2 puffs first thing in am and then another 2 puffs about 12 hours later. 08/30/15   Nyoka Cowden, MD  topiramate (TOPAMAX) 100 MG tablet One half tablet in the morning and one tablet in the evening 02/07/14   York Spaniel, MD  tretinoin (RETIN-A) 0.1 % cream Apply 1 application topically at bedtime.    Historical Provider, MD  Vitamin D, Ergocalciferol, (DRISDOL) 50000 UNITS CAPS capsule Take 1 capsule by mouth every 7 (seven) days. 07/03/15   Historical Provider, MD   Triage vitals: BP 116/79 mmHg  Pulse 82  Temp(Src) 98 F (36.7 C) (Oral)  Resp 16  Ht  (1.676 m)  Wt 230 lb (104.327 kg)  BMI 37.14 kg/m2  SpO2 98%  LMP 10/28/2015 Physical Exam  Constitutional: She is oriented to person, place, and time. She appears well-developed and well-nourished.  HENT:  Head: Normocephalic.  Eyes: EOM are normal.  Neck: Normal range of motion.  Pulmonary/Chest: Effort normal.  Abdominal: Soft. She exhibits no distension. There is no tenderness.  Genitourinary:  There is a 1 cm hemorrhoid noted at the 3:00 position. There is slight bleeding from this hemorrhoid.  Musculoskeletal: Normal range of motion.  Neurological: She is alert and oriented to person, place, and time.  Psychiatric: She has a normal mood and affect.  Nursing  note and vitals reviewed.   ED Course  Procedures  DIAGNOSTIC STUDIES: Oxygen Saturation is 98% on RA, normal by my interpretation.  COORDINATION OF CARE:  6:24 PM Discussed treatment plan which includes fiber, stool softener, and steroid cream with pt at bedside and pt agreed to plan.  Labs Review Labs Reviewed - No data to display  Imaging Review No results found. I have personally reviewed and evaluated these images and lab results as part of my medical decision-making.   EKG Interpretation None      MDM   Final diagnoses:  None    Patient has a bleeding hemorrhoid. Bleeding at this time is controlled. I will recommend stool softeners, fiber supplementation, steroids cream, and astringent. She is to follow-up with general surgery if not improving.  I personally performed the services described in this documentation, which was scribed in my presence. The recorded information has been reviewed and is accurate.      Geoffery Lyonsouglas Josalin Carneiro, MD 11/01/15 [phone]

## 2015-11-01 NOTE — Discharge Instructions (Signed)
Apply hydrocortisone cream twice daily alternated with Preparation H twice daily.  Metamucil: 1 heaping teaspoon in a glass of water 3 times daily.  Colace (equate stool softener ) 100 mg twice daily.  Follow-up with general surgery if not improving in the next 3 days, sooner if symptoms worsen or change.   Hemorrhoids Hemorrhoids are swollen veins around the rectum or anus. There are two types of hemorrhoids:   Internal hemorrhoids. These occur in the veins just inside the rectum. They may poke through to the outside and become irritated and painful.  External hemorrhoids. These occur in the veins outside the anus and can be felt as a painful swelling or hard lump near the anus. CAUSES  Pregnancy.   Obesity.   Constipation or diarrhea.   Straining to have a bowel movement.   Sitting for long periods on the toilet.  Heavy lifting or other activity that caused you to strain.  Anal intercourse. SYMPTOMS   Pain.   Anal itching or irritation.   Rectal bleeding.   Fecal leakage.   Anal swelling.   One or more lumps around the anus.  DIAGNOSIS  Your caregiver may be able to diagnose hemorrhoids by visual examination. Other examinations or tests that may be performed include:   Examination of the rectal area with a gloved hand (digital rectal exam).   Examination of anal canal using a small tube (scope).   A blood test if you have lost a significant amount of blood.  A test to look inside the colon (sigmoidoscopy or colonoscopy). TREATMENT Most hemorrhoids can be treated at home. However, if symptoms do not seem to be getting better or if you have a lot of rectal bleeding, your caregiver may perform a procedure to help make the hemorrhoids get smaller or remove them completely. Possible treatments include:   Placing a rubber band at the base of the hemorrhoid to cut off the circulation (rubber band ligation).   Injecting a chemical to shrink the  hemorrhoid (sclerotherapy).   Using a tool to burn the hemorrhoid (infrared light therapy).   Surgically removing the hemorrhoid (hemorrhoidectomy).   Stapling the hemorrhoid to block blood flow to the tissue (hemorrhoid stapling).  HOME CARE INSTRUCTIONS   Eat foods with fiber, such as whole grains, beans, nuts, fruits, and vegetables. Ask your doctor about taking products with added fiber in them (fibersupplements).  Increase fluid intake. Drink enough water and fluids to keep your urine clear or pale yellow.   Exercise regularly.   Go to the bathroom when you have the urge to have a bowel movement. Do not wait.   Avoid straining to have bowel movements.   Keep the anal area dry and clean. Use wet toilet paper or moist towelettes after a bowel movement.   Medicated creams and suppositories may be used or applied as directed.   Only take over-the-counter or prescription medicines as directed by your caregiver.   Take warm sitz baths for 15-20 minutes, 3-4 times a day to ease pain and discomfort.   Place ice packs on the hemorrhoids if they are tender and swollen. Using ice packs between sitz baths may be helpful.   Put ice in a plastic bag.   Place a towel between your skin and the bag.   Leave the ice on for 15-20 minutes, 3-4 times a day.   Do not use a donut-shaped pillow or sit on the toilet for long periods. This increases blood pooling and pain.  SEEK  MEDICAL CARE IF:  You have increasing pain and swelling that is not controlled by treatment or medicine.  You have uncontrolled bleeding.  You have difficulty or you are unable to have a bowel movement.  You have pain or inflammation outside the area of the hemorrhoids. MAKE SURE YOU:  Understand these instructions.  Will watch your condition.  Will get help right away if you are not doing well or get worse.   This information is not intended to replace advice given to you by your health care  provider. Make sure you discuss any questions you have with your health care provider.   Document Released: 12/06/2000 Document Revised: 11/25/2012 Document Reviewed: 10/13/2012 Elsevier Interactive Patient Education [employer]2016 [employer]

## 2015-12-19 ENCOUNTER — Other Ambulatory Visit: Payer: Self-pay | Admitting: Internal Medicine

## 2015-12-19 DIAGNOSIS — R748 Abnormal levels of other serum enzymes: Secondary | ICD-10-CM

## 2015-12-27 ENCOUNTER — Ambulatory Visit
Admission: RE | Admit: 2015-12-27 | Discharge: 2015-12-27 | Disposition: A | Discharge status: Home / Self Care | Payer: BLUE CROSS/BLUE SHIELD | Source: Ambulatory Visit | Attending: Internal Medicine | Admitting: Internal Medicine

## 2015-12-27 DIAGNOSIS — R748 Abnormal levels of other serum enzymes: Secondary | ICD-10-CM

## 2016-06-08 IMAGING — US US ABDOMEN COMPLETE
1 series · 14 of 25 positions shown · non-contrast
Comparison: 12/03/2010

CLINICAL DATA: Increased LFTs

EXAM:
ABDOMEN ULTRASOUND COMPLETE

[Series 1: us abdomen complete · 0.20mm/px · 14 of 67 slices shown]
[im 1/67]
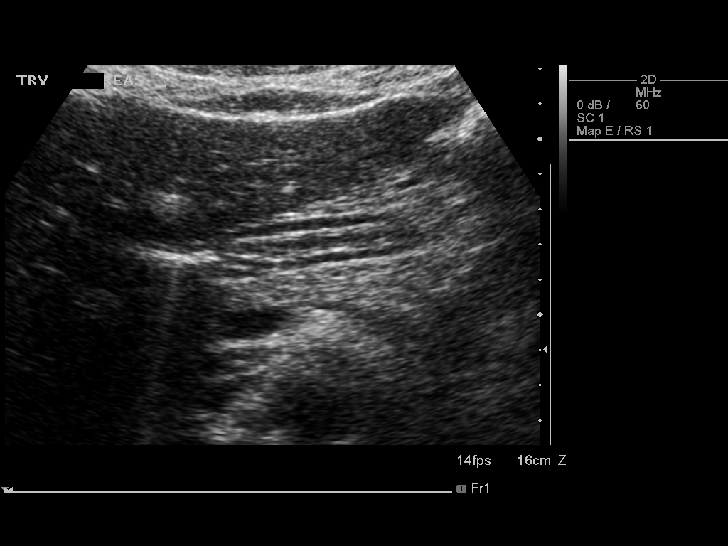
[im 6/67]
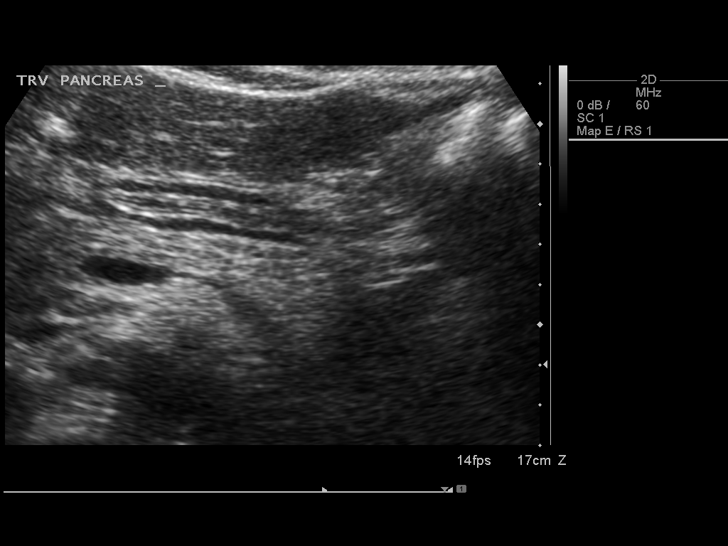
[im 12/67]
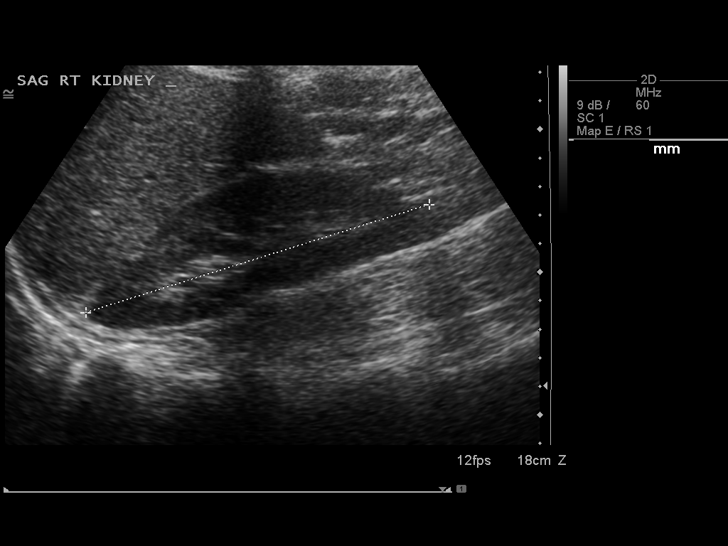
[im 17/67]
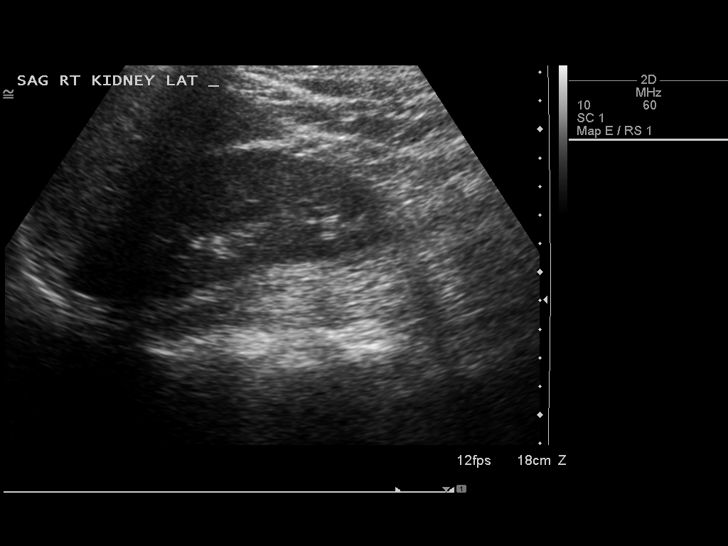
[im 23/67]
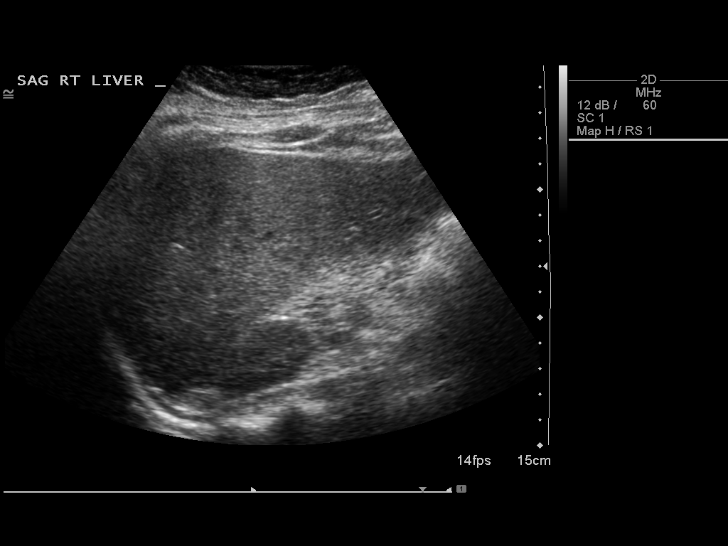
[im 25/67]
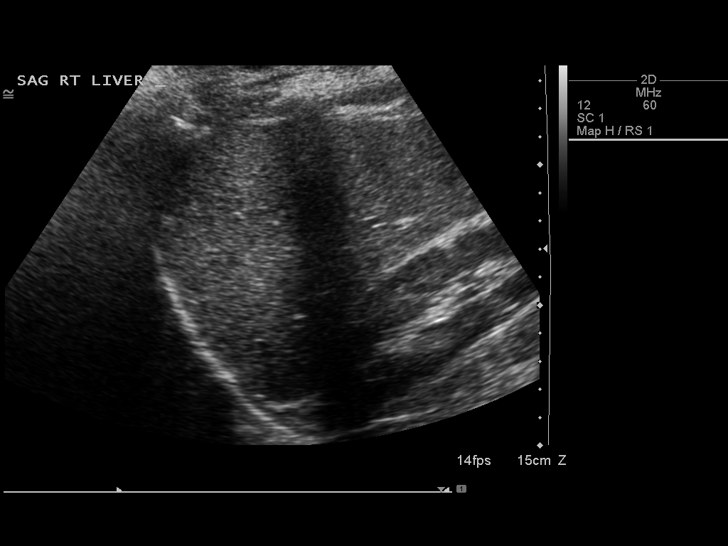
[im 31/67]
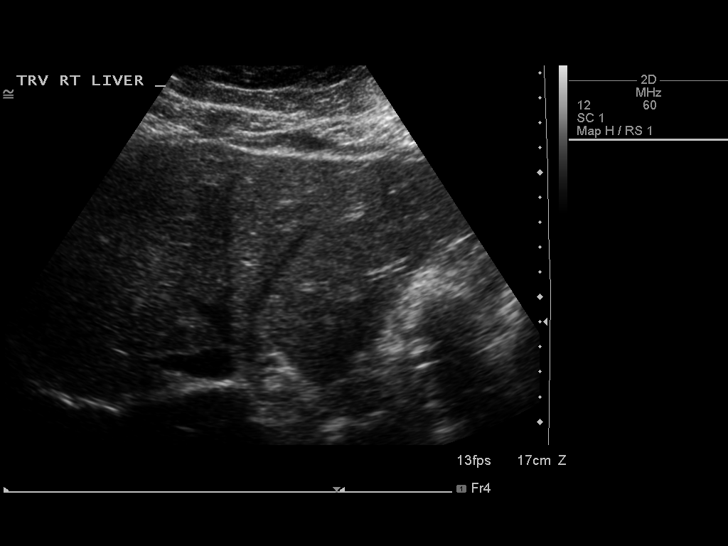
[im 36/67]
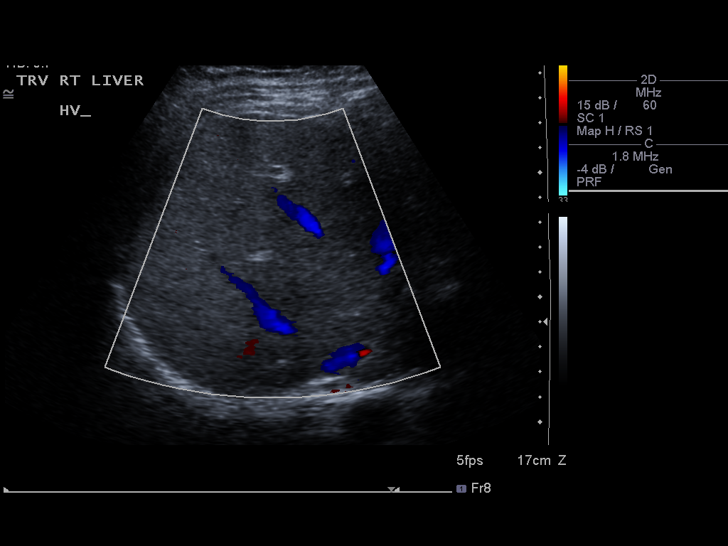
[im 42/67]
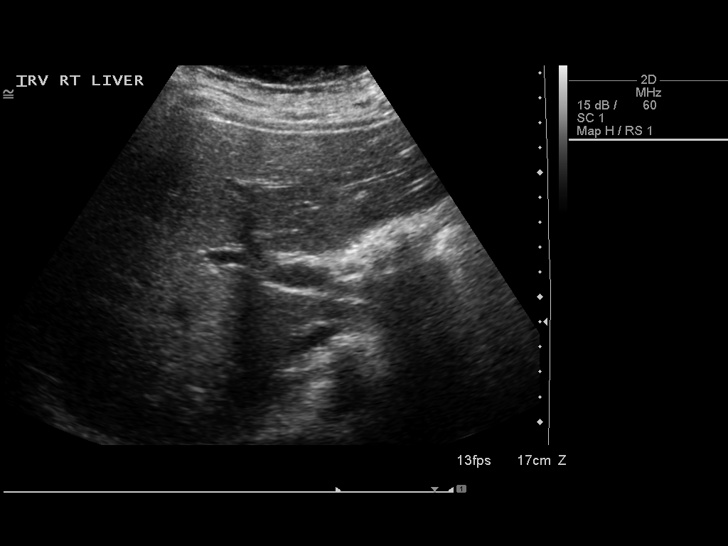
[im 45/67]
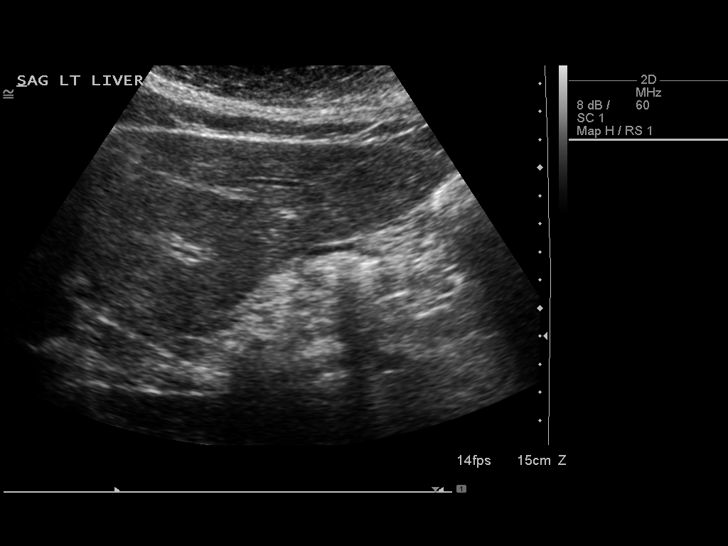
[im 50/67]
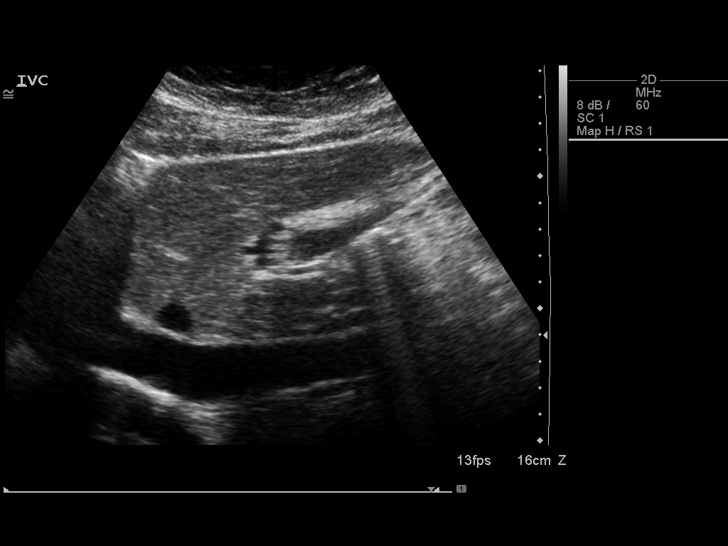
[im 56/67]
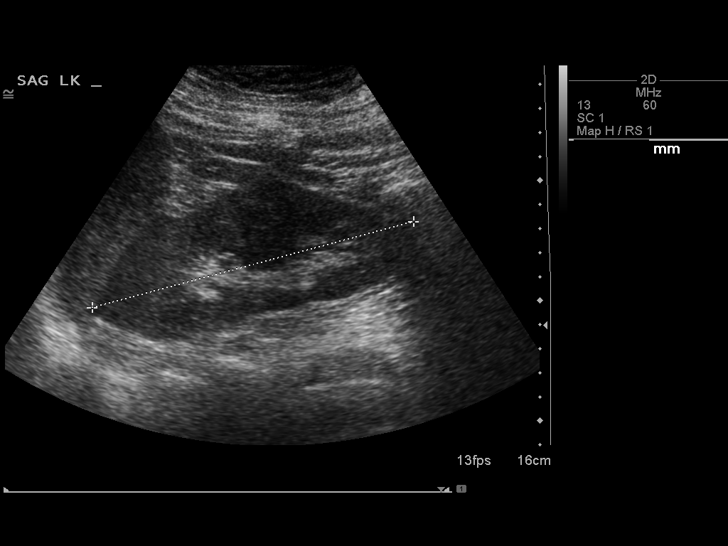
[im 61/67]
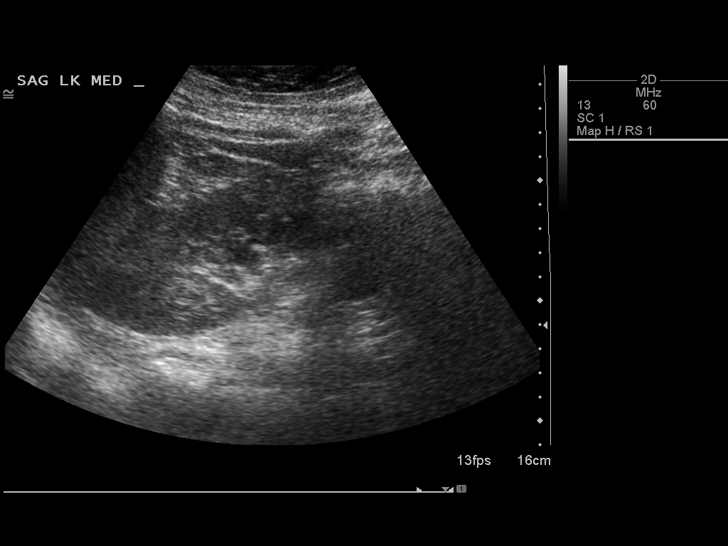
[im 67/67]
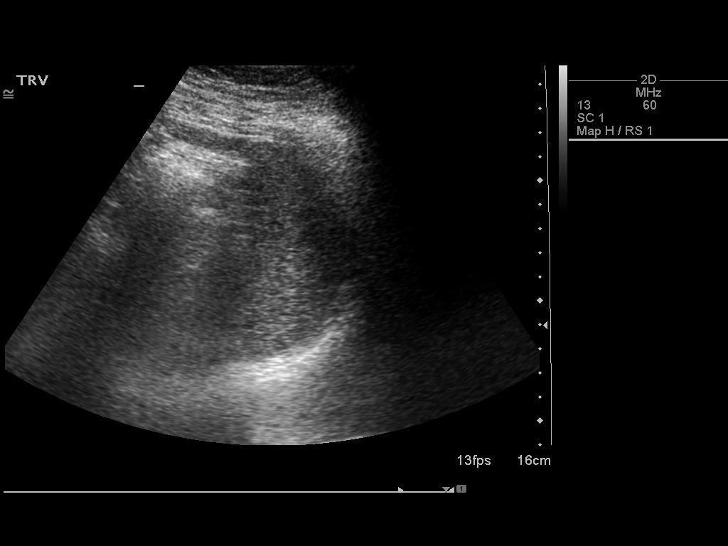

[14 of 25 positions shown; findings below may reference images not displayed]

FINDINGS: Gallbladder: Surgically absent

Common bile duct: Diameter: 5.7 mm proximal probable
postcholecystectomy

Liver: No focal hepatic mass. Mild heterogeneous liver echogenicity
without intrahepatic biliary ductal dilatation.

IVC: No abnormality visualized.

Pancreas: Limited assessment and visualization due to bowel gas.

Spleen: Size and appearance within normal limits. Measures 3.4 cm in
length

Right Kidney: Length: 12.3 cm. Echogenicity within normal limits. No
mass or hydronephrosis visualized.

Left Kidney: Length: 13.9 cm. Echogenicity within normal limits. No
mass or hydronephrosis visualized.

Abdominal aorta: No aneurysm visualized. Measures up to 2.2 cm in
diameter.

Other findings: None.
IMPRESSION: 1. Surgical absent gallbladder. Minimal prominent size CBD probable
postcholecystectomy.
2. No intrahepatic biliary ductal dilatation. No focal hepatic mass.
3. No hydronephrosis.
4. No aortic aneurysm.
